# Patient Record
Sex: Male | Born: 1945 | Race: White | Hispanic: No | Marital: Single | Smoking: Former smoker
Health system: Southern US, Community
[De-identification: ages and names within clinical notes are randomized; demographics above are authoritative.]

## PROBLEM LIST (undated history)

## (undated) DIAGNOSIS — K429 Umbilical hernia without obstruction or gangrene: Secondary | ICD-10-CM

## (undated) DIAGNOSIS — J449 Chronic obstructive pulmonary disease, unspecified: Secondary | ICD-10-CM

## (undated) DIAGNOSIS — E785 Hyperlipidemia, unspecified: Secondary | ICD-10-CM

## (undated) DIAGNOSIS — T7840XA Allergy, unspecified, initial encounter: Secondary | ICD-10-CM

## (undated) DIAGNOSIS — M19019 Primary osteoarthritis, unspecified shoulder: Secondary | ICD-10-CM

## (undated) DIAGNOSIS — R7302 Impaired glucose tolerance (oral): Secondary | ICD-10-CM

## (undated) DIAGNOSIS — D649 Anemia, unspecified: Secondary | ICD-10-CM

## (undated) DIAGNOSIS — E119 Type 2 diabetes mellitus without complications: Secondary | ICD-10-CM

## (undated) DIAGNOSIS — Z8619 Personal history of other infectious and parasitic diseases: Secondary | ICD-10-CM

## (undated) DIAGNOSIS — G4730 Sleep apnea, unspecified: Secondary | ICD-10-CM

## (undated) DIAGNOSIS — M1990 Unspecified osteoarthritis, unspecified site: Secondary | ICD-10-CM

## (undated) DIAGNOSIS — K635 Polyp of colon: Secondary | ICD-10-CM

## (undated) DIAGNOSIS — Z8739 Personal history of other diseases of the musculoskeletal system and connective tissue: Secondary | ICD-10-CM

## (undated) HISTORY — PX: COLONOSCOPY: SHX174

## (undated) HISTORY — PX: TONSILLECTOMY AND ADENOIDECTOMY: SUR1326

## (undated) HISTORY — PX: EYE SURGERY: SHX253

---

## 2005-06-07 ENCOUNTER — Ambulatory Visit: Payer: Self-pay | Admitting: Gastroenterology

## 2007-09-15 ENCOUNTER — Ambulatory Visit: Payer: Self-pay

## 2010-06-08 ENCOUNTER — Ambulatory Visit: Payer: Self-pay | Admitting: Ophthalmology

## 2013-07-01 HISTORY — PX: WRIST MASS EXCISION: SHX2674

## 2013-09-09 ENCOUNTER — Ambulatory Visit: Payer: Self-pay | Admitting: Orthopedic Surgery

## 2013-09-10 LAB — PATHOLOGY REPORT

## 2014-09-08 ENCOUNTER — Ambulatory Visit
Admit: 2014-09-08 | Disposition: A | Discharge status: Home / Self Care | Payer: Self-pay | Attending: Infectious Diseases | Admitting: Infectious Diseases

## 2014-09-30 ENCOUNTER — Ambulatory Visit
Admit: 2014-09-30 | Disposition: A | Discharge status: Home / Self Care | Payer: Self-pay | Attending: Infectious Diseases | Admitting: Infectious Diseases

## 2014-10-22 NOTE — Op Note (Signed)
PATIENT NAME:  Connor Miranda, Connor Miranda MR#:  [id number] DATE OF BIRTH:  [birth year]  DATE OF PROCEDURE:  09/09/2013  PREOPERATIVE DIAGNOSIS:  Right volar mass of right wrist.  POSTOPERATIVE DIAGNOSIS:  Right volar mass of right wrist.   PROCEDURE:  Excision of mass.   ANESTHESIA:  General.   SURGEON:  Hessie Knows, M.D.   DESCRIPTION OF PROCEDURE:  The patient was brought to the Operating Room and after adequate anesthesia was obtained, the right arm was prepped and draped in the usual sterile fashion, appropriate patient identification and timeout procedures completed.  After this was completed, the tourniquet was raised and a curvilinear incision was made centered over the mass.  It appeared to be in line with the palmaris longus after opening the skin and subcutaneous tissue of approximately 1 to 1.5 cm diameter, mass was exposed.  It did appear to be within the body of the palmaris longus.  The palmaris longus was identified distally and proximally and the mass removed along with a portion of the palmaris longus and sent as a single specimen.  It appeared to be well-differentiated from the surrounding tissue, separated easily with any invasive characteristic.  After removal of this, the wound was closed with simple interrupted 4-0 nylon skin sutures, infiltrated with 0.5% Sensorcaine without epinephrine for postop analgesia.  The patient tolerated the procedure well and was sent to the recovery room in stable condition.   ESTIMATED BLOOD LOSS:  Minimal.   COMPLICATIONS:  None.   SPECIMEN:  Excised volar mass from the palmaris longus.   TOURNIQUET TIME:  13 minutes at 250 mmHg.    ____________________________ Laurene Footman, MD mjm:ea D: 09/09/2013 18:38:33 ET T: 09/10/2013 06:09:10 ET JOB#: [id number]  cc: Laurene Footman, MD, <Dictator> Laurene Footman MD ELECTRONICALLY SIGNED 09/10/2013 10:58

## 2014-11-07 ENCOUNTER — Encounter: Payer: Medicare Other | Attending: Infectious Diseases | Admitting: Dietician

## 2014-11-07 ENCOUNTER — Encounter: Payer: Self-pay | Admitting: Dietician

## 2014-11-07 VITALS — Ht 70.0 in | Wt 216.8 lb

## 2014-11-07 DIAGNOSIS — E119 Type 2 diabetes mellitus without complications: Secondary | ICD-10-CM | POA: Diagnosis present

## 2014-11-07 DIAGNOSIS — G4730 Sleep apnea, unspecified: Secondary | ICD-10-CM | POA: Insufficient documentation

## 2014-11-07 DIAGNOSIS — Z8739 Personal history of other diseases of the musculoskeletal system and connective tissue: Secondary | ICD-10-CM | POA: Insufficient documentation

## 2014-11-07 DIAGNOSIS — E785 Hyperlipidemia, unspecified: Secondary | ICD-10-CM | POA: Insufficient documentation

## 2014-11-07 NOTE — Progress Notes (Signed)

## 2014-12-12 ENCOUNTER — Encounter: Payer: Self-pay | Admitting: *Deleted

## 2014-12-12 ENCOUNTER — Encounter: Payer: Medicare Other | Attending: Infectious Diseases | Admitting: *Deleted

## 2014-12-12 VITALS — Ht 70.0 in | Wt 206.5 lb

## 2014-12-12 DIAGNOSIS — E119 Type 2 diabetes mellitus without complications: Secondary | ICD-10-CM | POA: Diagnosis not present

## 2014-12-12 NOTE — Progress Notes (Signed)

## 2014-12-19 ENCOUNTER — Encounter: Payer: Self-pay | Admitting: Dietician

## 2014-12-19 ENCOUNTER — Encounter: Payer: Medicare Other | Admitting: Dietician

## 2014-12-19 VITALS — BP 120/90 | Ht 70.0 in | Wt 203.8 lb

## 2014-12-19 DIAGNOSIS — E119 Type 2 diabetes mellitus without complications: Secondary | ICD-10-CM

## 2014-12-19 NOTE — Progress Notes (Signed)

## 2015-01-05 ENCOUNTER — Encounter: Payer: Self-pay | Admitting: Dietician

## 2015-08-09 ENCOUNTER — Encounter: Payer: Self-pay | Admitting: *Deleted

## 2015-08-10 ENCOUNTER — Ambulatory Visit: Payer: Medicare Other | Admitting: Anesthesiology

## 2015-08-10 ENCOUNTER — Encounter: Payer: Self-pay | Admitting: *Deleted

## 2015-08-10 ENCOUNTER — Ambulatory Visit
Admission: RE | Admit: 2015-08-10 | Discharge: 2015-08-10 | Disposition: A | Discharge status: Home / Self Care | Payer: Medicare Other | Source: Ambulatory Visit | Attending: Gastroenterology | Admitting: Gastroenterology

## 2015-08-10 ENCOUNTER — Encounter
Admission: RE | Disposition: A | Discharge status: Home / Self Care | Payer: Self-pay | Source: Ambulatory Visit | Attending: Gastroenterology

## 2015-08-10 DIAGNOSIS — Z79899 Other long term (current) drug therapy: Secondary | ICD-10-CM | POA: Diagnosis not present

## 2015-08-10 DIAGNOSIS — Z87891 Personal history of nicotine dependence: Secondary | ICD-10-CM | POA: Insufficient documentation

## 2015-08-10 DIAGNOSIS — Z1211 Encounter for screening for malignant neoplasm of colon: Secondary | ICD-10-CM | POA: Diagnosis not present

## 2015-08-10 DIAGNOSIS — J449 Chronic obstructive pulmonary disease, unspecified: Secondary | ICD-10-CM | POA: Insufficient documentation

## 2015-08-10 DIAGNOSIS — M109 Gout, unspecified: Secondary | ICD-10-CM | POA: Insufficient documentation

## 2015-08-10 DIAGNOSIS — E7439 Other disorders of intestinal carbohydrate absorption: Secondary | ICD-10-CM | POA: Insufficient documentation

## 2015-08-10 DIAGNOSIS — M19019 Primary osteoarthritis, unspecified shoulder: Secondary | ICD-10-CM | POA: Insufficient documentation

## 2015-08-10 DIAGNOSIS — D124 Benign neoplasm of descending colon: Secondary | ICD-10-CM | POA: Diagnosis not present

## 2015-08-10 DIAGNOSIS — Z86010 Personal history of colonic polyps: Secondary | ICD-10-CM | POA: Diagnosis not present

## 2015-08-10 DIAGNOSIS — E119 Type 2 diabetes mellitus without complications: Secondary | ICD-10-CM | POA: Insufficient documentation

## 2015-08-10 DIAGNOSIS — G4730 Sleep apnea, unspecified: Secondary | ICD-10-CM | POA: Diagnosis not present

## 2015-08-10 DIAGNOSIS — E785 Hyperlipidemia, unspecified: Secondary | ICD-10-CM | POA: Insufficient documentation

## 2015-08-10 HISTORY — DX: Chronic obstructive pulmonary disease, unspecified: J44.9

## 2015-08-10 HISTORY — DX: Hyperlipidemia, unspecified: E78.5

## 2015-08-10 HISTORY — DX: Allergy, unspecified, initial encounter: T78.40XA

## 2015-08-10 HISTORY — PX: COLONOSCOPY WITH PROPOFOL: SHX5780

## 2015-08-10 HISTORY — DX: Personal history of other diseases of the musculoskeletal system and connective tissue: Z87.39

## 2015-08-10 HISTORY — DX: Sleep apnea, unspecified: G47.30

## 2015-08-10 HISTORY — DX: Impaired glucose tolerance (oral): R73.02

## 2015-08-10 HISTORY — DX: Personal history of other infectious and parasitic diseases: Z86.19

## 2015-08-10 HISTORY — DX: Primary osteoarthritis, unspecified shoulder: M19.019

## 2015-08-10 HISTORY — DX: Type 2 diabetes mellitus without complications: E11.9

## 2015-08-10 HISTORY — DX: Unspecified osteoarthritis, unspecified site: M19.90

## 2015-08-10 HISTORY — DX: Polyp of colon: K63.5

## 2015-08-10 LAB — GLUCOSE, CAPILLARY: Glucose-Capillary: 134 mg/dL — ABNORMAL HIGH (ref 65–99)

## 2015-08-10 SURGERY — COLONOSCOPY WITH PROPOFOL
Anesthesia: General

## 2015-08-10 MED ORDER — SODIUM CHLORIDE 0.9 % IV SOLN
INTRAVENOUS | Status: DC
  Administered 2015-08-10: 08:00:00 via INTRAVENOUS

## 2015-08-10 MED ORDER — SODIUM CHLORIDE 0.9 % IV SOLN: INTRAVENOUS | Status: DC

## 2015-08-10 MED ORDER — PROPOFOL 500 MG/50ML IV EMUL
INTRAVENOUS | Status: DC | PRN
  Administered 2015-08-10: 75 ug/kg/min via INTRAVENOUS

## 2015-08-10 MED ORDER — PROPOFOL 10 MG/ML IV BOLUS
INTRAVENOUS | Status: DC | PRN
  Administered 2015-08-10: 80 mg via INTRAVENOUS

## 2015-08-10 MED ORDER — LACTATED RINGERS IV SOLN
INTRAVENOUS | Status: DC | PRN
  Administered 2015-08-10: 08:00:00 via INTRAVENOUS

## 2015-08-10 NOTE — Transfer of Care (Signed)
Immediate Anesthesia Transfer of Care Note  Patient: Connor Miranda  Procedure(s) Performed: Procedure(s): COLONOSCOPY WITH PROPOFOL (N/A)  Patient Location: PACU and Endoscopy Unit  Anesthesia Type:General  Level of Consciousness: awake, alert  and oriented  Airway & Oxygen Therapy: Patient Spontanous Breathing  Post-op Assessment: Report given to RN and Post -op Vital signs reviewed and stable  Post vital signs: Reviewed and stable  Last Vitals:  Filed Vitals:   08/10/15 0736  BP: 134/86  Pulse: 81  Temp: [id number]    Complications: No apparent anesthesia complications

## 2015-08-10 NOTE — Anesthesia Postprocedure Evaluation (Signed)
Anesthesia Post Note  Patient: Connor Miranda  Procedure(s) Performed: Procedure(s) (LRB): COLONOSCOPY WITH PROPOFOL (N/A)  Patient location during evaluation: Endoscopy Anesthesia Type: General Level of consciousness: awake and alert Pain management: pain level controlled Vital Signs Assessment: post-procedure vital signs reviewed and stable Respiratory status: spontaneous breathing, nonlabored ventilation, respiratory function stable and patient connected to nasal cannula oxygen Cardiovascular status: blood pressure returned to baseline and stable Postop Assessment: no signs of nausea or vomiting Anesthetic complications: no    Last Vitals:  Filed Vitals:   08/10/15 0736 08/10/15 0841  BP: 134/86   Pulse: 81   Temp: 35.9 C 35.8 C    Last Pain:  Filed Vitals:   08/10/15 1007  PainSc: 0-No pain                 Precious Haws Piscitello

## 2015-08-10 NOTE — Op Note (Signed)
[facility] Gastroenterology Patient Name: Connor Miranda Procedure Date: 08/10/2015 8:03 AM MRN: [id number] Account #: [id number] Date of Birth: 1945/09/29 Admit Type: Outpatient Age: 70 Room: [facility] ENDO ROOM 4 Gender: Male Note Status: Finalized Procedure:         Colonoscopy Indications:       Screening for colorectal malignant neoplasm Providers:         Lupita Dawn. Candace Cruise, MD Referring MD:      Adrian Prows (Referring MD) Medicines:         Monitored Anesthesia Care Complications:     No immediate complications. Procedure:         Pre-Anesthesia Assessment:                    - Prior to the procedure, a History and Physical was                     performed, and patient medications, allergies and                     sensitivities were reviewed. The patient's tolerance of                     previous anesthesia was reviewed.                    - The risks and benefits of the procedure and the sedation                     options and risks were discussed with the patient. All                     questions were answered and informed consent was obtained.                    - After reviewing the risks and benefits, the patient was                     deemed in satisfactory condition to undergo the procedure.                    After obtaining informed consent, the colonoscope was                     passed under direct vision. Throughout the procedure, the                     patient's blood pressure, pulse, and oxygen saturations                     were monitored continuously. The Colonoscope was                     introduced through the anus and advanced to the the cecum,                     identified by appendiceal orifice and ileocecal valve. The                     colonoscopy was performed without difficulty. The patient                     tolerated the procedure well. The quality of the bowel  preparation was good. Findings:      A  medium polyp was found in the descending colon. The polyp was sessile.       The polyp was removed with a saline injection-lift technique using a hot       snare. Resection and retrieval were complete.      The exam was otherwise without abnormality. Impression:        - One medium polyp in the descending colon. Resected and                     retrieved.                    - The examination was otherwise normal. Recommendation:    - Discharge patient to home.                    - Await pathology results.                    - Repeat colonoscopy in 3 - 5 years for surveillance based                     on pathology results.                    - The findings and recommendations were discussed with the                     patient. Procedure Code(s): --- Professional ---                    [phone], Colonoscopy, flexible; with endoscopic mucosal                     resection Diagnosis Code(s): --- Professional ---                    Z12.11, Encounter for screening for malignant neoplasm of                     colon                    D12.4, Benign neoplasm of descending colon CPT copyright 2014 American Medical Association. All rights reserved. The codes documented in this report are preliminary and upon coder review may  be revised to meet current compliance requirements. [name], MD 08/10/2015 8:38:26 AM This report has been signed electronically. Number of Addenda: 0 Note Initiated On: 08/10/2015 8:03 AM Scope Withdrawal Time: 0 hours 25 minutes 2 seconds  Total Procedure Duration: 0 hours 27 minutes 50 seconds       [facility]

## 2015-08-10 NOTE — H&P (Signed)
Primary Care Physician:  Adrian Prows, MD Primary Gastroenterologist:  Dr. Candace Cruise  Pre-Procedure History & Physical: HPI:  Connor Miranda is a 70 y.o. male is here for an colonoscopy.   Past Medical History  Diagnosis Date  . Allergic state   . Colon polyps   . Degenerative arthritis of shoulder region   . Type 2 diabetes mellitus ([place])   . Sleep apnea   . Glucose intolerance (impaired glucose tolerance)   . History of chicken pox   . History of gout   . Hyperlipidemia   . Osteoarthritis   . COPD (chronic obstructive pulmonary disease) [facility])     Past Surgical History  Procedure Laterality Date  . Eye surgery Bilateral     Eyelid surgery  . Colonoscopy    . Tonsillectomy and adenoidectomy    . Wrist mass excision Right     Prior to Admission medications   Medication Sig Start Date End Date Taking? Authorizing Provider  acetaZOLAMIDE (DIAMOX) 125 MG tablet Take 125 mg by mouth 2 (two) times daily.   Yes Historical Provider, MD  atorvastatin (LIPITOR) 40 MG tablet  12/19/14  Yes Historical Provider, MD  ibuprofen (ADVIL,MOTRIN) 200 MG tablet Take by mouth.   Yes Historical Provider, MD  indomethacin (INDOCIN) 50 MG capsule Take 50 mg by mouth 2 (two) times daily as needed.    Yes Historical Provider, MD  ONE TOUCH ULTRA TEST test strip  12/19/14  Yes Historical Provider, MD  tadalafil (CIALIS) 20 MG tablet Take by mouth.   Yes Historical Provider, MD  Guaifenesin-Codeine (CODEINE-GUAIFENESIN PO) Take 5 mLs by mouth at bedtime as needed (Take 40mL nightly as needed for cough; codeine-guaifenesin 10-100mg /56mL oral liquid). Reported on 08/10/2015    Historical Provider, MD  Jonetta Speak LANCETS [id number] [place]  12/19/14   Historical Provider, MD    Allergies as of 07/05/2015  . (Not on File)    History reviewed. No pertinent family history.  Social History   Social History  . Marital Status: Single    Spouse Name: N/A  . Number of Children: N/A  . Years of Education:  N/A   Occupational History  . Not on file.   Social History Main Topics  . Smoking status: Former Smoker    Quit date: 08/09/1994  . Smokeless tobacco: Not on file  . Alcohol Use: 1.8 oz/week    3 Cans of beer per week     Comment: 2 lite beers, 1 glass wine in past week  . Drug Use: No  . Sexual Activity: Not on file   Other Topics Concern  . Not on file   Social History Narrative    Review of Systems: See HPI, otherwise negative ROS  Physical Exam: BP 134/86 mmHg  Pulse 81  Temp(Src) 96.7 F (35.9 C) (Tympanic)  Ht 5\' 10"  (1.778 m)  Wt 88.451 kg (195 lb)  BMI 27.98 kg/m2  SpO2 97% General:   Alert,  pleasant and cooperative in NAD Head:  Normocephalic and atraumatic. Neck:  Supple; no masses or thyromegaly. Lungs:  Clear throughout to auscultation.    Heart:  Regular rate and rhythm. Abdomen:  Soft, nontender and nondistended. Normal bowel sounds, without guarding, and without rebound.   Neurologic:  Alert and  oriented x4;  grossly normal neurologically.  Impression/Plan: Connor Miranda is here for an colonoscopy to be performed for screening. Risks, benefits, limitations, and alternatives regarding colonoscopy have been reviewed with the patient.  Questions have  been answered.  All parties agreeable.   Jelena Malicoat, Lupita Dawn, MD  08/10/2015, 7:55 AM

## 2015-08-10 NOTE — Anesthesia Preprocedure Evaluation (Signed)
Anesthesia Evaluation  Patient identified by MRN, date of birth, ID band Patient awake    Reviewed: Allergy & Precautions, H&P , NPO status , Patient's Chart, lab work & pertinent test results  History of Anesthesia Complications Negative for: history of anesthetic complications  Airway Mallampati: III  TM Distance: >3 FB Neck ROM: limited    Dental  (+) Poor Dentition, Chipped   Pulmonary neg pulmonary ROS, neg shortness of breath, sleep apnea , COPD, former smoker,    Pulmonary exam normal breath sounds clear to auscultation       Cardiovascular (-) angina(-) Past MI and (-) DOE negative cardio ROS Normal cardiovascular exam Rhythm:regular Rate:Normal     Neuro/Psych negative neurological ROS  negative psych ROS   GI/Hepatic negative GI ROS, Neg liver ROS,   Endo/Other  diabetes, Type 2  Renal/GU negative Renal ROS  negative genitourinary   Musculoskeletal  (+) Arthritis ,   Abdominal   Peds  Hematology negative hematology ROS (+)   Anesthesia Other Findings Past Medical History:   Allergic state                                               Colon polyps                                                 Degenerative arthritis of shoulder region                    Type 2 diabetes mellitus (HCC)                               Sleep apnea                                                  Glucose intolerance (impaired glucose toleranc*              History of chicken pox                                       History of gout                                              Hyperlipidemia                                               Osteoarthritis                                              Past Surgical History:   EYE SURGERY  Bilateral                Comment:Eyelid surgery   COLONOSCOPY                                                   TONSILLECTOMY AND ADENOIDECTOMY                                WRIST MASS EXCISION                             Right                Reproductive/Obstetrics negative OB ROS                             Anesthesia Physical Anesthesia Plan  ASA: III  Anesthesia Plan: General   Post-op Pain Management:    Induction:   Airway Management Planned:   Additional Equipment:   Intra-op Plan:   Post-operative Plan:   Informed Consent: I have reviewed the patients History and Physical, chart, labs and discussed the procedure including the risks, benefits and alternatives for the proposed anesthesia with the patient or authorized representative who has indicated his/her understanding and acceptance.   Dental Advisory Given  Plan Discussed with: Anesthesiologist, CRNA and Surgeon  Anesthesia Plan Comments:         Anesthesia Quick Evaluation

## 2015-08-14 LAB — SURGICAL PATHOLOGY

## 2016-07-15 ENCOUNTER — Other Ambulatory Visit: Payer: Self-pay | Admitting: Physical Medicine and Rehabilitation

## 2016-07-15 DIAGNOSIS — M5416 Radiculopathy, lumbar region: Secondary | ICD-10-CM

## 2016-07-25 ENCOUNTER — Ambulatory Visit
Admission: RE | Admit: 2016-07-25 | Discharge: 2016-07-25 | Disposition: A | Discharge status: Home / Self Care | Payer: Medicare Other | Source: Ambulatory Visit | Attending: Physical Medicine and Rehabilitation | Admitting: Physical Medicine and Rehabilitation

## 2016-07-25 DIAGNOSIS — M5416 Radiculopathy, lumbar region: Secondary | ICD-10-CM

## 2016-07-25 DIAGNOSIS — M5116 Intervertebral disc disorders with radiculopathy, lumbar region: Secondary | ICD-10-CM | POA: Diagnosis not present

## 2016-07-25 DIAGNOSIS — M5127 Other intervertebral disc displacement, lumbosacral region: Secondary | ICD-10-CM | POA: Insufficient documentation

## 2016-07-25 DIAGNOSIS — M4807 Spinal stenosis, lumbosacral region: Secondary | ICD-10-CM | POA: Diagnosis not present

## 2018-05-06 ENCOUNTER — Other Ambulatory Visit: Payer: Self-pay

## 2018-05-06 ENCOUNTER — Other Ambulatory Visit: Payer: Self-pay | Admitting: Physical Medicine and Rehabilitation

## 2018-05-06 DIAGNOSIS — M25512 Pain in left shoulder: Secondary | ICD-10-CM

## 2018-05-25 ENCOUNTER — Ambulatory Visit: Payer: Medicare Other

## 2018-05-31 DIAGNOSIS — D649 Anemia, unspecified: Secondary | ICD-10-CM

## 2018-05-31 DIAGNOSIS — G4730 Sleep apnea, unspecified: Secondary | ICD-10-CM

## 2018-05-31 HISTORY — DX: Anemia, unspecified: D64.9

## 2018-05-31 HISTORY — DX: Sleep apnea, unspecified: G47.30

## 2018-06-04 ENCOUNTER — Ambulatory Visit
Admission: RE | Admit: 2018-06-04 | Discharge: 2018-06-04 | Disposition: A | Discharge status: Home / Self Care | Payer: Medicare Other | Source: Ambulatory Visit | Attending: Physical Medicine and Rehabilitation | Admitting: Physical Medicine and Rehabilitation

## 2018-06-04 DIAGNOSIS — M19012 Primary osteoarthritis, left shoulder: Secondary | ICD-10-CM | POA: Diagnosis not present

## 2018-06-04 DIAGNOSIS — M7582 Other shoulder lesions, left shoulder: Secondary | ICD-10-CM | POA: Insufficient documentation

## 2018-06-04 DIAGNOSIS — M25512 Pain in left shoulder: Secondary | ICD-10-CM | POA: Diagnosis not present

## 2018-06-05 NOTE — Discharge Instructions (Signed)

## 2018-06-08 NOTE — Anesthesia Preprocedure Evaluation (Addendum)
Anesthesia Evaluation  Patient identified by MRN, date of birth, ID band Patient awake    Reviewed: Allergy & Precautions, NPO status , Patient's Chart, lab work & pertinent test results  History of Anesthesia Complications Negative for: history of anesthetic complications  Airway Mallampati: II   Neck ROM: Full    Dental  (+) Partial Upper Lower implants:   Pulmonary sleep apnea , former smoker (quit 1996),    Pulmonary exam normal breath sounds clear to auscultation       Cardiovascular Exercise Tolerance: Good negative cardio ROS   Rhythm:Irregular Rate:Normal  ECG 06/10/18: NSR with sinus arrhythmia   Neuro/Psych negative neurological ROS     GI/Hepatic negative GI ROS,   Endo/Other  diabetes, Type 2  Renal/GU negative Renal ROS     Musculoskeletal  (+) Arthritis , Gout    Abdominal   Peds  Hematology B12 deficiency   Anesthesia Other Findings   Reproductive/Obstetrics                            Anesthesia Physical Anesthesia Plan  ASA: II  Anesthesia Plan: MAC   Post-op Pain Management:    Induction: Intravenous  PONV Risk Score and Plan: 2 and TIVA and Midazolam  Airway Management Planned: Natural Airway  Additional Equipment:   Intra-op Plan:   Post-operative Plan:   Informed Consent: I have reviewed the patients History and Physical, chart, labs and discussed the procedure including the risks, benefits and alternatives for the proposed anesthesia with the patient or authorized representative who has indicated his/her understanding and acceptance.     Plan Discussed with: CRNA  Anesthesia Plan Comments:       Anesthesia Quick Evaluation

## 2018-06-09 HISTORY — PX: EYE SURGERY: SHX253

## 2018-06-10 ENCOUNTER — Encounter
Admission: RE | Disposition: A | Discharge status: Home / Self Care | Payer: Self-pay | Source: Ambulatory Visit | Attending: Ophthalmology

## 2018-06-10 ENCOUNTER — Ambulatory Visit: Payer: Medicare Other | Admitting: Anesthesiology

## 2018-06-10 ENCOUNTER — Ambulatory Visit
Admission: RE | Admit: 2018-06-10 | Discharge: 2018-06-10 | Disposition: A | Discharge status: Home / Self Care | Payer: Medicare Other | Source: Ambulatory Visit | Attending: Ophthalmology | Admitting: Ophthalmology

## 2018-06-10 DIAGNOSIS — Z87891 Personal history of nicotine dependence: Secondary | ICD-10-CM | POA: Insufficient documentation

## 2018-06-10 DIAGNOSIS — J449 Chronic obstructive pulmonary disease, unspecified: Secondary | ICD-10-CM | POA: Diagnosis not present

## 2018-06-10 DIAGNOSIS — H5703 Miosis: Secondary | ICD-10-CM | POA: Insufficient documentation

## 2018-06-10 DIAGNOSIS — H2512 Age-related nuclear cataract, left eye: Secondary | ICD-10-CM | POA: Insufficient documentation

## 2018-06-10 DIAGNOSIS — E538 Deficiency of other specified B group vitamins: Secondary | ICD-10-CM | POA: Diagnosis not present

## 2018-06-10 DIAGNOSIS — Z79899 Other long term (current) drug therapy: Secondary | ICD-10-CM | POA: Diagnosis not present

## 2018-06-10 DIAGNOSIS — E1136 Type 2 diabetes mellitus with diabetic cataract: Secondary | ICD-10-CM | POA: Insufficient documentation

## 2018-06-10 DIAGNOSIS — E7800 Pure hypercholesterolemia, unspecified: Secondary | ICD-10-CM | POA: Insufficient documentation

## 2018-06-10 DIAGNOSIS — Z7984 Long term (current) use of oral hypoglycemic drugs: Secondary | ICD-10-CM | POA: Diagnosis not present

## 2018-06-10 DIAGNOSIS — G4730 Sleep apnea, unspecified: Secondary | ICD-10-CM | POA: Insufficient documentation

## 2018-06-10 DIAGNOSIS — Z7982 Long term (current) use of aspirin: Secondary | ICD-10-CM | POA: Insufficient documentation

## 2018-06-10 HISTORY — PX: CATARACT EXTRACTION W/PHACO: SHX586

## 2018-06-10 LAB — GLUCOSE, CAPILLARY
GLUCOSE-CAPILLARY: 137 mg/dL — AB (ref 70–99)
Glucose-Capillary: 142 mg/dL — ABNORMAL HIGH (ref 70–99)

## 2018-06-10 SURGERY — PHACOEMULSIFICATION, CATARACT, WITH IOL INSERTION
Anesthesia: Monitor Anesthesia Care | Site: Eye | Laterality: Left

## 2018-06-10 MED ORDER — ARMC OPHTHALMIC DILATING DROPS
1.0000 "application " | OPHTHALMIC | Status: DC | PRN
  Administered 2018-06-10 (×3): 1 via OPHTHALMIC

## 2018-06-10 MED ORDER — ONDANSETRON HCL 4 MG/2ML IJ SOLN: 4.0000 mg | Freq: Once | INTRAMUSCULAR | Status: DC | PRN

## 2018-06-10 MED ORDER — EPINEPHRINE PF 1 MG/ML IJ SOLN
INTRAOCULAR | Status: DC | PRN
  Administered 2018-06-10: 66 mL via OPHTHALMIC

## 2018-06-10 MED ORDER — MIDAZOLAM HCL 2 MG/2ML IJ SOLN: INTRAMUSCULAR | Status: DC | PRN

## 2018-06-10 MED ORDER — LACTATED RINGERS IV SOLN: INTRAVENOUS | Status: DC

## 2018-06-10 MED ORDER — TETRACAINE HCL 0.5 % OP SOLN
1.0000 [drp] | OPHTHALMIC | Status: DC | PRN
  Administered 2018-06-10 (×2): 1 [drp] via OPHTHALMIC

## 2018-06-10 MED ORDER — NA HYALUR & NA CHOND-NA HYALUR 0.4-0.35 ML IO KIT
PACK | INTRAOCULAR | Status: DC | PRN
  Administered 2018-06-10: 1 mL via INTRAOCULAR

## 2018-06-10 MED ORDER — ACETAMINOPHEN 160 MG/5ML PO SOLN: 325.0000 mg | ORAL | Status: DC | PRN

## 2018-06-10 MED ORDER — BRIMONIDINE TARTRATE-TIMOLOL 0.2-0.5 % OP SOLN
OPHTHALMIC | Status: DC | PRN
  Administered 2018-06-10: 1 [drp] via OPHTHALMIC

## 2018-06-10 MED ORDER — LIDOCAINE HCL (PF) 2 % IJ SOLN
INTRAOCULAR | Status: DC | PRN
  Administered 2018-06-10: 1 mL

## 2018-06-10 MED ORDER — MOXIFLOXACIN HCL 0.5 % OP SOLN
1.0000 [drp] | OPHTHALMIC | Status: DC | PRN
  Administered 2018-06-10 (×3): 1 [drp] via OPHTHALMIC

## 2018-06-10 MED ORDER — MIDAZOLAM HCL 2 MG/2ML IJ SOLN
INTRAMUSCULAR | Status: DC | PRN
  Administered 2018-06-10: 2 mg via INTRAVENOUS

## 2018-06-10 MED ORDER — ACETAMINOPHEN 325 MG PO TABS: 650.0000 mg | ORAL_TABLET | Freq: Once | ORAL | Status: DC | PRN

## 2018-06-10 MED ORDER — CEFUROXIME OPHTHALMIC INJECTION 1 MG/0.1 ML
INJECTION | OPHTHALMIC | Status: DC | PRN
  Administered 2018-06-10: 0.1 mL via INTRACAMERAL

## 2018-06-10 MED ORDER — FENTANYL CITRATE (PF) 100 MCG/2ML IJ SOLN
INTRAMUSCULAR | Status: DC | PRN
  Administered 2018-06-10 (×2): 50 ug via INTRAVENOUS

## 2018-06-10 SURGICAL SUPPLY — 27 items
CANNULA ANT/CHMB 27G (MISCELLANEOUS) ×1 IMPLANT
CANNULA ANT/CHMB 27GA (MISCELLANEOUS) ×3 IMPLANT
GLOVE SURG LX 7.5 STRW (GLOVE) ×2
GLOVE SURG LX STRL 7.5 STRW (GLOVE) ×1 IMPLANT
GLOVE SURG TRIUMPH 8.0 PF LTX (GLOVE) ×3 IMPLANT
GOWN STRL REUS W/ TWL LRG LVL3 (GOWN DISPOSABLE) ×2 IMPLANT
GOWN STRL REUS W/TWL LRG LVL3 (GOWN DISPOSABLE) ×4
MARKER SKIN DUAL TIP RULER LAB (MISCELLANEOUS) ×3 IMPLANT
NDL FILTER BLUNT 18X1 1/2 (NEEDLE) ×1 IMPLANT
NDL RETROBULBAR .5 NSTRL (NEEDLE) IMPLANT
NEEDLE FILTER BLUNT 18X 1/2SAF (NEEDLE) ×2
NEEDLE FILTER BLUNT 18X1 1/2 (NEEDLE) ×1 IMPLANT
PACK CATARACT BRASINGTON (MISCELLANEOUS) ×3 IMPLANT
PACK EYE AFTER SURG (MISCELLANEOUS) ×3 IMPLANT
PACK OPTHALMIC (MISCELLANEOUS) ×3 IMPLANT
RING MALYGIN (MISCELLANEOUS) ×2 IMPLANT
RING MALYGIN 7.0 (MISCELLANEOUS) IMPLANT
SUT ETHILON 10-0 CS-B-6CS-B-6 (SUTURE)
SUT VICRYL  9 0 (SUTURE)
SUT VICRYL 9 0 (SUTURE) IMPLANT
SUTURE EHLN 10-0 CS-B-6CS-B-6 (SUTURE) IMPLANT
SYR 3ML LL SCALE MARK (SYRINGE) ×3 IMPLANT
SYR 5ML LL (SYRINGE) ×3 IMPLANT
SYR TB 1ML LUER SLIP (SYRINGE) ×3 IMPLANT
TECHNIS MULTIFOCAL IOL 23.0 D (Intraocular Lens) ×2 IMPLANT
WATER STERILE IRR 500ML POUR (IV SOLUTION) ×3 IMPLANT
WIPE NON LINTING 3.25X3.25 (MISCELLANEOUS) ×3 IMPLANT

## 2018-06-10 NOTE — Anesthesia Procedure Notes (Signed)
Procedure Name: MAC Date/Time: 06/10/2018 8:18 AM Performed by: Cameron Ali, CRNA Pre-anesthesia Checklist: Patient identified, Emergency Drugs available, Suction available, Timeout performed and Patient being monitored Patient Re-evaluated:Patient Re-evaluated prior to induction Oxygen Delivery Method: Nasal cannula Placement Confirmation: positive ETCO2

## 2018-06-10 NOTE — Op Note (Signed)
OPERATIVE NOTE  Connor Miranda [id number] 06/10/2018  PREOPERATIVE DIAGNOSIS:   Nuclear sclerotic cataract left eye with miotic pupil      H25.12   POSTOPERATIVE DIAGNOSIS:   Nuclear sclerotic cataract left eye with miotic pupil.     PROCEDURE:  Phacoemulsification with posterior chamber intraocular lens implantation of the left eye which required pupil stretching with the Malyugin pupil expansion device   LENS:   Implant Name Type Inv. Item Serial No. Manufacturer Lot No. LRB No. Used  LENS IOL RESTORE 23.0 - [id number]  LENS IOL RESTORE 23.0 [id number] AMO  Left 1     Tecnis [id number] (NOT ReSTOR) 23.0 with 2.75 D Add power   ULTRASOUND TIME: 16 % of 1 minutes, 3 seconds.  CDE 10.2   SURGEON:  Wyonia Hough, MD   ANESTHESIA: Topical with tetracaine drops and 2% Xylocaine jelly, augmented with 1% preservative-free intracameral lidocaine.   COMPLICATIONS:  None.   DESCRIPTION OF PROCEDURE:  The patient was identified in the holding room and transported to the operating room and placed in the supine position under the operating microscope.  The left eye was identified as the operative eye and it was prepped and draped in the usual sterile ophthalmic fashion.   A 1 millimeter clear-corneal paracentesis was made at the 1:30 position.  The anterior chamber was filled with Viscoat viscoelastic.  0.5 ml of preservative-free 1% lidocaine was injected into the anterior chamber.  A 2.4 millimeter keratome was used to make a near-clear corneal incision at the 10:30 position.  A Malyugin pupil expander was then placed through the main incision and into the anterior chamber of the eye.  The edge of the iris was secured on the lip of the pupil expander and it was released, thereby expanding the pupil to approximately 6 millimeters for completion of the cataract surgery.  Additional Viscoat was placed in the anterior chamber.  A cystotome and capsulorrhexis forceps were used to make a curvilinear  capsulorrhexis.   Balanced salt solution was used to hydrodissect and hydrodelineate the lens nucleus.   Phacoemulsification was used in stop and chop fashion to remove the lens, nucleus and epinucleus.  The remaining cortex was aspirated using the irrigation aspiration handpiece.  Additional Provisc was placed into the eye to distend the capsular bag for lens placement.  A lens was then injected into the capsular bag.  The pupil expanding ring was removed using a Kuglen hook and insertion device. The remaining viscoelastic was aspirated from the capsular bag and the anterior chamber.  The anterior chamber was filled with balanced salt solution to inflate to a physiologic pressure.   Wounds were hydrated with balanced salt solution.  The anterior chamber was inflated to a physiologic pressure with balanced salt solution.  No wound leaks were noted. Cefuroxime 0.1 ml of a 10mg /ml solution was injected into the anterior chamber for a dose of 1 mg of intracameral antibiotic at the completion of the case.   Timolol and Brimonidine drops were applied to the eye.  The patient was taken to the recovery room in stable condition without complications of anesthesia or surgery.  [name] 06/10/2018, 8:43 AM

## 2018-06-10 NOTE — Transfer of Care (Signed)
Immediate Anesthesia Transfer of Care Note  Patient: Connor Miranda  Procedure(s) Performed: CATARACT EXTRACTION PHACO AND INTRAOCULAR LENS PLACEMENT (IOC)V  LEFT DIABETIC MULTIFOCAL LENS (Left Eye)  Patient Location: PACU  Anesthesia Type: MAC  Level of Consciousness: awake, alert  and patient cooperative  Airway and Oxygen Therapy: Patient Spontanous Breathing and Patient connected to supplemental oxygen  Post-op Assessment: Post-op Vital signs reviewed, Patient's Cardiovascular Status Stable, Respiratory Function Stable, Patent Airway and No signs of Nausea or vomiting  Post-op Vital Signs: Reviewed and stable  Complications: No apparent anesthesia complications

## 2018-06-10 NOTE — H&P (Signed)

## 2018-06-10 NOTE — Anesthesia Postprocedure Evaluation (Signed)
Anesthesia Post Note  Patient: Connor Miranda  Procedure(s) Performed: CATARACT EXTRACTION PHACO AND INTRAOCULAR LENS PLACEMENT (IOC)V  LEFT DIABETIC MULTIFOCAL LENS (Left Eye)  Patient location during evaluation: PACU Anesthesia Type: MAC Level of consciousness: awake and alert, oriented and patient cooperative Pain management: pain level controlled Vital Signs Assessment: post-procedure vital signs reviewed and stable Respiratory status: spontaneous breathing, nonlabored ventilation and respiratory function stable Cardiovascular status: blood pressure returned to baseline and stable Postop Assessment: adequate PO intake Anesthetic complications: no    Darrin Nipper

## 2018-06-11 ENCOUNTER — Encounter: Payer: Self-pay | Admitting: Ophthalmology

## 2018-06-26 ENCOUNTER — Ambulatory Visit: Payer: Medicare Other | Attending: Internal Medicine

## 2018-06-26 DIAGNOSIS — G4733 Obstructive sleep apnea (adult) (pediatric): Secondary | ICD-10-CM | POA: Insufficient documentation

## 2018-06-29 ENCOUNTER — Other Ambulatory Visit: Payer: Self-pay

## 2018-06-29 ENCOUNTER — Encounter
Admission: RE | Admit: 2018-06-29 | Discharge: 2018-06-29 | Disposition: A | Discharge status: Home / Self Care | Payer: Medicare Other | Source: Ambulatory Visit | Attending: Surgery | Admitting: Surgery

## 2018-06-29 DIAGNOSIS — Z01812 Encounter for preprocedural laboratory examination: Secondary | ICD-10-CM | POA: Insufficient documentation

## 2018-06-29 HISTORY — DX: Umbilical hernia without obstruction or gangrene: K42.9

## 2018-06-29 HISTORY — DX: Anemia, unspecified: D64.9

## 2018-06-29 LAB — PROTIME-INR
INR: 0.89
Prothrombin Time: 12 s (ref 11.4–15.2)

## 2018-06-29 LAB — APTT: aPTT: 37 s — ABNORMAL HIGH (ref 24–36)

## 2018-06-29 NOTE — Patient Instructions (Signed)
Your procedure is scheduled on: Thursday, July 09, 2018  Report to [facility]     DO NOT STOP ON THE FIRST FLOOR TO REGISTER  To find out your arrival time please call [phone] between 1PM - 3PM on Wednesday July 08, 2018  Remember: Instructions that are not followed completely may result in serious medical risk,  up to and including death, or upon the discretion of your surgeon and anesthesiologist your  surgery may need to be rescheduled.     _X__ 1. Do not eat food after midnight the night before your procedure.                 No gum chewing or hard candies.                      ABSOLUTELY NOTHING SOLID IN YOUR MOUTH AFTER MIDNIGHT                   You may drink clear liquids up to 2 hours before you are scheduled to arrive for your surgery-                   DO not drink clear liquids within 2 hours of the start of your surgery.                  Clear Liquids include:  water, apple juice without pulp, clear carbohydrate                 drink such as Clearfast of Gatorade, Black Coffee or Tea (Do not add                 anything to coffee or tea).  __X__2.  On the morning of surgery brush your teeth with toothpaste and water,                    You may rinse your mouth with mouthwash if you wish.                       Do not swallow any toothpaste of mouthwash.     _X__ 3.  No Alcohol for 24 hours before or after surgery.   _X__ 4.  Do Not Smoke or use e-cigarettes For 24 Hours Prior to Your Surgery.                 Do not use any chewable tobacco products for at least 6 hours prior to                 surgery.  ____  5.  Bring all medications with you on the day of surgery if instructed.   __X__  6.  Notify your doctor if there is any change in your medical condition      (cold, fever, infections).     Do not wear jewelry, make-up, hairpins, clips or nail polish. Do not wear lotions, powders, or perfumes. You may  NOT wear deodorant. Do not shave 48 hours prior to surgery. Men may shave face and neck. Do not bring valuables to the hospital.    [facility] is not responsible for any belongings or valuables.  Contacts, dentures or bridgework may not be worn into surgery. Leave your suitcase in the car. After surgery it may be brought to your room. For patients admitted to the hospital, discharge time is determined by your treatment  team.   Patients discharged the day of surgery will not be allowed to drive home.   Please read over the following fact sheets that you were given:             PREPARING FOR SURGERY               ADVANCE DIRECTIVES   ____ Take these medicines the morning of surgery with A SIP OF WATER:    1.EYE DROPS  2.   3.   4.  5.  6.  ____ Fleet Enema (as directed)   _X___ Use CHG Soap as directed  ____ Use inhalers on the day of surgery  _X___ Stop metformin 2 days prior to surgery. LAST DOSE ON 07/06/2018    ____ Take 1/2 of usual insulin dose the night before surgery. No insulin the morning          of surgery.   _X___ Stop ALL ASPIRIN PRODUCTS AS OF 07/02/2018  __X__ Stop Anti-inflammatories AS OF 07/02/2018               THIS INCLUDES IBUPROFEN / MOTRIN / ADVIL / ALEVE / INDOCIN   __X__ Stop supplements until after surgery.                 THIS INCLUDES VITAMIN B 12  ____ Bring C-Pap to the hospital.   CONTINUE USING VIBRAMYCIN AND ATORVASTATIN AS USUAL  WEAR A LARGE, LOOSE FITTING SHIRT TO THE HOSPITAL  HAVE STOOL SOFTENERS AT HOME TO USE AFTER SURGERY     I RECOMMEND THAT YOU START THIS A DAY OR TWO PRIOR TO SURGERY  IF YOU HAVE COMPLETED THE MEDICAL DIRECTIVES, PLEASE BRING WITH  YOU          SO WE MAY MAKE A COPY FOR YOUR CHART

## 2018-06-30 NOTE — Discharge Instructions (Signed)

## 2018-07-07 ENCOUNTER — Ambulatory Visit: Payer: Medicare Other | Admitting: Anesthesiology

## 2018-07-07 ENCOUNTER — Encounter
Admission: RE | Disposition: A | Discharge status: Home / Self Care | Payer: Self-pay | Source: Home / Self Care | Attending: Ophthalmology

## 2018-07-07 ENCOUNTER — Ambulatory Visit
Admission: RE | Admit: 2018-07-07 | Discharge: 2018-07-07 | Disposition: A | Discharge status: Home / Self Care | Payer: Medicare Other | Attending: Ophthalmology | Admitting: Ophthalmology

## 2018-07-07 DIAGNOSIS — E119 Type 2 diabetes mellitus without complications: Secondary | ICD-10-CM | POA: Insufficient documentation

## 2018-07-07 DIAGNOSIS — H2511 Age-related nuclear cataract, right eye: Secondary | ICD-10-CM | POA: Diagnosis not present

## 2018-07-07 DIAGNOSIS — G4730 Sleep apnea, unspecified: Secondary | ICD-10-CM | POA: Insufficient documentation

## 2018-07-07 DIAGNOSIS — J449 Chronic obstructive pulmonary disease, unspecified: Secondary | ICD-10-CM | POA: Diagnosis not present

## 2018-07-07 DIAGNOSIS — Z87891 Personal history of nicotine dependence: Secondary | ICD-10-CM | POA: Insufficient documentation

## 2018-07-07 HISTORY — PX: CATARACT EXTRACTION W/PHACO: SHX586

## 2018-07-07 LAB — GLUCOSE, CAPILLARY
GLUCOSE-CAPILLARY: 128 mg/dL — AB (ref 70–99)
Glucose-Capillary: 123 mg/dL — ABNORMAL HIGH (ref 70–99)

## 2018-07-07 SURGERY — PHACOEMULSIFICATION, CATARACT, WITH IOL INSERTION
Anesthesia: Monitor Anesthesia Care | Site: Eye | Laterality: Right

## 2018-07-07 MED ORDER — ACETAMINOPHEN 160 MG/5ML PO SOLN: 325.0000 mg | Freq: Once | ORAL | Status: DC

## 2018-07-07 MED ORDER — LIDOCAINE HCL (PF) 2 % IJ SOLN
INTRAOCULAR | Status: DC | PRN
  Administered 2018-07-07: .5 mL

## 2018-07-07 MED ORDER — LACTATED RINGERS IV SOLN: INTRAVENOUS | Status: DC

## 2018-07-07 MED ORDER — NA HYALUR & NA CHOND-NA HYALUR 0.4-0.35 ML IO KIT
PACK | INTRAOCULAR | Status: DC | PRN
  Administered 2018-07-07: 1 mL via INTRAOCULAR

## 2018-07-07 MED ORDER — ACETAMINOPHEN 325 MG PO TABS: 325.0000 mg | ORAL_TABLET | Freq: Once | ORAL | Status: DC

## 2018-07-07 MED ORDER — ARMC OPHTHALMIC DILATING DROPS
1.0000 "application " | OPHTHALMIC | Status: DC | PRN
  Administered 2018-07-07 (×3): 1 via OPHTHALMIC

## 2018-07-07 MED ORDER — CEFUROXIME OPHTHALMIC INJECTION 1 MG/0.1 ML
INJECTION | OPHTHALMIC | Status: DC | PRN
  Administered 2018-07-07: 0.1 mL via INTRACAMERAL

## 2018-07-07 MED ORDER — MIDAZOLAM HCL 2 MG/2ML IJ SOLN
INTRAMUSCULAR | Status: DC | PRN
  Administered 2018-07-07: 1 mg via INTRAVENOUS

## 2018-07-07 MED ORDER — EPINEPHRINE PF 1 MG/ML IJ SOLN
INTRAOCULAR | Status: DC | PRN
  Administered 2018-07-07: 62 mL via OPHTHALMIC

## 2018-07-07 MED ORDER — MOXIFLOXACIN HCL 0.5 % OP SOLN
1.0000 [drp] | OPHTHALMIC | Status: DC | PRN
  Administered 2018-07-07 (×3): 1 [drp] via OPHTHALMIC

## 2018-07-07 MED ORDER — TETRACAINE HCL 0.5 % OP SOLN
1.0000 [drp] | OPHTHALMIC | Status: DC | PRN
  Administered 2018-07-07 (×2): 1 [drp] via OPHTHALMIC

## 2018-07-07 MED ORDER — BRIMONIDINE TARTRATE-TIMOLOL 0.2-0.5 % OP SOLN
OPHTHALMIC | Status: DC | PRN
  Administered 2018-07-07: 1 [drp] via OPHTHALMIC

## 2018-07-07 SURGICAL SUPPLY — 26 items
CANNULA ANT/CHMB 27G (MISCELLANEOUS) ×1 IMPLANT
CANNULA ANT/CHMB 27GA (MISCELLANEOUS) ×3 IMPLANT
GLOVE SURG LX 7.5 STRW (GLOVE) ×2
GLOVE SURG LX STRL 7.5 STRW (GLOVE) ×1 IMPLANT
GLOVE SURG TRIUMPH 8.0 PF LTX (GLOVE) ×3 IMPLANT
GOWN STRL REUS W/ TWL LRG LVL3 (GOWN DISPOSABLE) ×2 IMPLANT
GOWN STRL REUS W/TWL LRG LVL3 (GOWN DISPOSABLE) ×4
LENS IOL MULTIFOCL TECNIS 22.5 (Intraocular Lens) ×2 IMPLANT
MARKER SKIN DUAL TIP RULER LAB (MISCELLANEOUS) ×3 IMPLANT
NDL FILTER BLUNT 18X1 1/2 (NEEDLE) ×1 IMPLANT
NDL RETROBULBAR .5 NSTRL (NEEDLE) IMPLANT
NEEDLE FILTER BLUNT 18X 1/2SAF (NEEDLE) ×2
NEEDLE FILTER BLUNT 18X1 1/2 (NEEDLE) ×1 IMPLANT
PACK CATARACT BRASINGTON (MISCELLANEOUS) ×3 IMPLANT
PACK EYE AFTER SURG (MISCELLANEOUS) ×3 IMPLANT
PACK OPTHALMIC (MISCELLANEOUS) ×3 IMPLANT
RING MALYGIN 7.0 (MISCELLANEOUS) ×2 IMPLANT
SUT ETHILON 10-0 CS-B-6CS-B-6 (SUTURE)
SUT VICRYL  9 0 (SUTURE)
SUT VICRYL 9 0 (SUTURE) IMPLANT
SUTURE EHLN 10-0 CS-B-6CS-B-6 (SUTURE) IMPLANT
SYR 3ML LL SCALE MARK (SYRINGE) ×3 IMPLANT
SYR 5ML LL (SYRINGE) ×3 IMPLANT
SYR TB 1ML LUER SLIP (SYRINGE) ×3 IMPLANT
WATER STERILE IRR 500ML POUR (IV SOLUTION) ×3 IMPLANT
WIPE NON LINTING 3.25X3.25 (MISCELLANEOUS) ×3 IMPLANT

## 2018-07-07 NOTE — H&P (Signed)

## 2018-07-07 NOTE — Transfer of Care (Signed)
Immediate Anesthesia Transfer of Care Note  Patient: Connor Miranda  Procedure(s) Performed: CATARACT EXTRACTION PHACO AND INTRAOCULAR LENS PLACEMENT (IOC)  COMPLICATED DIABETIC MULTIFOCAL LENS (Right Eye)  Patient Location: PACU  Anesthesia Type: MAC  Level of Consciousness: awake, alert  and patient cooperative  Airway and Oxygen Therapy: Patient Spontanous Breathing and Patient connected to supplemental oxygen  Post-op Assessment: Post-op Vital signs reviewed, Patient's Cardiovascular Status Stable, Respiratory Function Stable, Patent Airway and No signs of Nausea or vomiting  Post-op Vital Signs: Reviewed and stable  Complications: No apparent anesthesia complications

## 2018-07-07 NOTE — Op Note (Signed)
OPERATIVE NOTE  Connor Miranda [id number] 07/07/2018   PREOPERATIVE DIAGNOSIS:    Nuclear Sclerotic Cataract Right eye with miotic pupil.        H25.11  POSTOPERATIVE DIAGNOSIS: Nuclear Sclerotic Cataract Right eye with miotic pupil.          PROCEDURE:  Phacoemusification with posterior chamber intraocular lens placement of the right eye which required pupil stretching with the Malyugin pupil expansion device.  LENS:   Implant Name Type Inv. Item Serial No. Manufacturer Lot No. LRB No. Used  TECNIS MULTIFOCAL IOL LENS Intraocular Lens  [id number] [employer]  Right 1    [id number] 22.5 D Tecnis IOL with +2.75 D of add power   ULTRASOUND TIME: 17 % of 1 minutes 12 seconds, CDE 12.4  SURGEON:  Wyonia Hough, MD   ANESTHESIA:  Topical with tetracaine drops and 2% Xylocaine jelly, augmented with 1% preservative-free intracameral lidocaine.   COMPLICATIONS:  None.   DESCRIPTION OF PROCEDURE:  The patient was identified in the holding room and transported to the operating room and placed in the supine position under the operating microscope. Theright eye was identified as the operative eye and it was prepped and draped in the usual sterile ophthalmic fashion.   A 1 millimeter clear-corneal paracentesis was made at the 3:00 position.  0.5 ml of preservative-free 1% lidocaine was injected into the anterior chamber. The anterior chamber was filled with Viscoat viscoelastic.  A 2.4 millimeter keratome was used to make a near-clear corneal incision at the 12:00 position. A Malyugin pupil expander was then placed through the main incision and into the anterior chamber of the eye.  The edge of the iris was secured on the lip of the pupil expander and it was released, thereby expanding the pupil to approximately 7 millimeters for completion of the cataract surgery.  Additional Viscoat was placed in the anterior chamber.  A cystotome and capsulorrhexis forceps were used to make a  curvilinear capsulorrhexis.   Balanced salt solution was used to hydrodissect and hydrodelineate the lens nucleus.   Phacoemulsification was used in stop and chop fashion to remove the lens, nucleus and epinucleus.  The remaining cortex was aspirated using the irrigation aspiration handpiece.  Additional Provisc was placed into the eye to distend the capsular bag for lens placement.  A lens was then injected into the capsular bag.  The pupil expanding ring was removed using a Kuglen hook and insertion device. The remaining viscoelastic was aspirated from the capsular bag and the anterior chamber.  The anterior chamber was filled with balanced salt solution to inflate to a physiologic pressure.  Wounds were hydrated with balanced salt solution.  The anterior chamber was inflated to a physiologic pressure with balanced salt solution.  No wound leaks were noted.Cefuroxime 0.1 ml of a 10mg /ml solution was injected into the anterior chamber for a dose of 1 mg of intracameral antibiotic at the completion of the case. Timolol and Brimonidine drops were applied to the eye.  The patient was taken to the recovery room in stable condition without complications of anesthesia or surgery.  [name] 07/07/2018, 8:55 AM

## 2018-07-07 NOTE — Anesthesia Postprocedure Evaluation (Signed)
Anesthesia Post Note  Patient: Connor Miranda  Procedure(s) Performed: CATARACT EXTRACTION PHACO AND INTRAOCULAR LENS PLACEMENT (IOC)  COMPLICATED DIABETIC MULTIFOCAL LENS (Right Eye)  Patient location during evaluation: PACU Anesthesia Type: MAC Level of consciousness: awake and alert and oriented Pain management: satisfactory to patient Vital Signs Assessment: post-procedure vital signs reviewed and stable Respiratory status: spontaneous breathing, nonlabored ventilation and respiratory function stable Cardiovascular status: blood pressure returned to baseline and stable Postop Assessment: Adequate PO intake and No signs of nausea or vomiting Anesthetic complications: no    Raliegh Ip

## 2018-07-07 NOTE — Anesthesia Preprocedure Evaluation (Signed)
Anesthesia Evaluation  Patient identified by MRN, date of birth, ID band Patient awake    Reviewed: Allergy & Precautions, H&P , NPO status , Patient's Chart, lab work & pertinent test results  Airway Mallampati: II  TM Distance: >3 FB Neck ROM: full    Dental no notable dental hx. (+) Partial Upper   Pulmonary sleep apnea , COPD, former smoker,    Pulmonary exam normal breath sounds clear to auscultation       Cardiovascular Normal cardiovascular exam Rhythm:regular Rate:Normal     Neuro/Psych    GI/Hepatic   Endo/Other  diabetes, Oral Hypoglycemic Agents  Renal/GU      Musculoskeletal   Abdominal   Peds  Hematology   Anesthesia Other Findings   Reproductive/Obstetrics                             Anesthesia Physical Anesthesia Plan  ASA: III  Anesthesia Plan: MAC   Post-op Pain Management:    Induction:   PONV Risk Score and Plan: 1 and Midazolam and Treatment may vary due to age or medical condition  Airway Management Planned:   Additional Equipment:   Intra-op Plan:   Post-operative Plan:   Informed Consent: I have reviewed the patients History and Physical, chart, labs and discussed the procedure including the risks, benefits and alternatives for the proposed anesthesia with the patient or authorized representative who has indicated his/her understanding and acceptance.     Plan Discussed with: CRNA  Anesthesia Plan Comments:         Anesthesia Quick Evaluation

## 2018-07-08 ENCOUNTER — Encounter: Payer: Self-pay | Admitting: Ophthalmology

## 2018-07-08 MED ORDER — CEFAZOLIN SODIUM-DEXTROSE 2-4 GM/100ML-% IV SOLN
2.0000 g | Freq: Once | INTRAVENOUS | Status: AC
  Administered 2018-07-09: 2 g via INTRAVENOUS

## 2018-07-09 ENCOUNTER — Encounter: Payer: Self-pay | Admitting: *Deleted

## 2018-07-09 ENCOUNTER — Encounter
Admission: RE | Disposition: A | Discharge status: Home / Self Care | Payer: Self-pay | Source: Ambulatory Visit | Attending: Surgery

## 2018-07-09 ENCOUNTER — Other Ambulatory Visit: Payer: Self-pay

## 2018-07-09 ENCOUNTER — Ambulatory Visit
Admission: RE | Admit: 2018-07-09 | Discharge: 2018-07-09 | Disposition: A | Discharge status: Home / Self Care | Payer: Medicare Other | Source: Ambulatory Visit | Attending: Surgery | Admitting: Surgery

## 2018-07-09 ENCOUNTER — Ambulatory Visit: Payer: Medicare Other | Admitting: Anesthesiology

## 2018-07-09 DIAGNOSIS — Z79899 Other long term (current) drug therapy: Secondary | ICD-10-CM | POA: Diagnosis not present

## 2018-07-09 DIAGNOSIS — M94212 Chondromalacia, left shoulder: Secondary | ICD-10-CM | POA: Diagnosis not present

## 2018-07-09 DIAGNOSIS — Z87891 Personal history of nicotine dependence: Secondary | ICD-10-CM | POA: Diagnosis not present

## 2018-07-09 DIAGNOSIS — Z7984 Long term (current) use of oral hypoglycemic drugs: Secondary | ICD-10-CM | POA: Insufficient documentation

## 2018-07-09 DIAGNOSIS — J449 Chronic obstructive pulmonary disease, unspecified: Secondary | ICD-10-CM | POA: Insufficient documentation

## 2018-07-09 DIAGNOSIS — E785 Hyperlipidemia, unspecified: Secondary | ICD-10-CM | POA: Diagnosis not present

## 2018-07-09 DIAGNOSIS — M7522 Bicipital tendinitis, left shoulder: Secondary | ICD-10-CM | POA: Insufficient documentation

## 2018-07-09 DIAGNOSIS — G4730 Sleep apnea, unspecified: Secondary | ICD-10-CM | POA: Diagnosis not present

## 2018-07-09 DIAGNOSIS — E119 Type 2 diabetes mellitus without complications: Secondary | ICD-10-CM | POA: Insufficient documentation

## 2018-07-09 DIAGNOSIS — M7582 Other shoulder lesions, left shoulder: Secondary | ICD-10-CM | POA: Diagnosis present

## 2018-07-09 HISTORY — PX: SHOULDER ARTHROSCOPY WITH DEBRIDEMENT AND BICEP TENDON REPAIR: SHX5690

## 2018-07-09 LAB — GLUCOSE, CAPILLARY
GLUCOSE-CAPILLARY: 159 mg/dL — AB (ref 70–99)
Glucose-Capillary: 138 mg/dL — ABNORMAL HIGH (ref 70–99)

## 2018-07-09 SURGERY — SHOULDER ARTHROSCOPY WITH DEBRIDEMENT AND BICEP TENDON REPAIR
Anesthesia: General | Site: Shoulder | Laterality: Left

## 2018-07-09 MED ORDER — FENTANYL CITRATE (PF) 100 MCG/2ML IJ SOLN
INTRAMUSCULAR | Status: AC
  Filled 2018-07-09: qty 2

## 2018-07-09 MED ORDER — FAMOTIDINE 20 MG PO TABS
20.0000 mg | ORAL_TABLET | Freq: Once | ORAL | Status: AC
  Administered 2018-07-09: 20 mg via ORAL

## 2018-07-09 MED ORDER — FENTANYL CITRATE (PF) 100 MCG/2ML IJ SOLN
INTRAMUSCULAR | Status: DC | PRN
  Administered 2018-07-09: 100 ug via INTRAVENOUS

## 2018-07-09 MED ORDER — OXYCODONE HCL 5 MG PO TABS: 5.0000 mg | ORAL_TABLET | ORAL | 0 refills | Status: AC | PRN

## 2018-07-09 MED ORDER — MIDAZOLAM HCL 2 MG/2ML IJ SOLN
INTRAMUSCULAR | Status: AC
  Administered 2018-07-09: 1 mg via INTRAVENOUS
  Filled 2018-07-09: qty 2

## 2018-07-09 MED ORDER — LIDOCAINE HCL (PF) 1 % IJ SOLN
INTRAMUSCULAR | Status: AC
  Filled 2018-07-09: qty 5

## 2018-07-09 MED ORDER — SODIUM CHLORIDE 0.9 % IV SOLN
INTRAVENOUS | Status: DC
  Administered 2018-07-09: 09:00:00 via INTRAVENOUS

## 2018-07-09 MED ORDER — BUPIVACAINE HCL (PF) 0.5 % IJ SOLN
INTRAMUSCULAR | Status: AC
  Filled 2018-07-09: qty 10

## 2018-07-09 MED ORDER — MIDAZOLAM HCL 2 MG/2ML IJ SOLN
1.0000 mg | Freq: Once | INTRAMUSCULAR | Status: AC
  Administered 2018-07-09: 1 mg via INTRAVENOUS

## 2018-07-09 MED ORDER — OXYCODONE HCL 5 MG PO TABS
5.0000 mg | ORAL_TABLET | ORAL | Status: DC | PRN
  Administered 2018-07-09: 5 mg via ORAL
  Filled 2018-07-09 (×2): qty 2

## 2018-07-09 MED ORDER — OXYCODONE HCL 5 MG PO TABS
ORAL_TABLET | ORAL | Status: AC
  Filled 2018-07-09: qty 1

## 2018-07-09 MED ORDER — ONDANSETRON HCL 4 MG PO TABS: 4.0000 mg | ORAL_TABLET | Freq: Four times a day (QID) | ORAL | Status: DC | PRN

## 2018-07-09 MED ORDER — BUPIVACAINE HCL (PF) 0.5 % IJ SOLN
INTRAMUSCULAR | Status: DC | PRN
  Administered 2018-07-09: 10 mL via PERINEURAL

## 2018-07-09 MED ORDER — BUPIVACAINE LIPOSOME 1.3 % IJ SUSP
INTRAMUSCULAR | Status: DC | PRN
  Administered 2018-07-09: 20 mL via PERINEURAL

## 2018-07-09 MED ORDER — DEXAMETHASONE SODIUM PHOSPHATE 10 MG/ML IJ SOLN
INTRAMUSCULAR | Status: DC | PRN
  Administered 2018-07-09: 5 mg via INTRAVENOUS

## 2018-07-09 MED ORDER — FENTANYL CITRATE (PF) 100 MCG/2ML IJ SOLN: 25.0000 ug | INTRAMUSCULAR | Status: DC | PRN

## 2018-07-09 MED ORDER — ROCURONIUM BROMIDE 100 MG/10ML IV SOLN
INTRAVENOUS | Status: DC | PRN
  Administered 2018-07-09: 50 mg via INTRAVENOUS

## 2018-07-09 MED ORDER — LIDOCAINE HCL (PF) 1 % IJ SOLN
INTRAMUSCULAR | Status: DC | PRN
  Administered 2018-07-09: 5 mL via SUBCUTANEOUS

## 2018-07-09 MED ORDER — CEFAZOLIN SODIUM-DEXTROSE 2-4 GM/100ML-% IV SOLN
INTRAVENOUS | Status: AC
  Filled 2018-07-09: qty 100

## 2018-07-09 MED ORDER — METOCLOPRAMIDE HCL 10 MG PO TABS: 5.0000 mg | ORAL_TABLET | Freq: Three times a day (TID) | ORAL | Status: DC | PRN

## 2018-07-09 MED ORDER — LIDOCAINE HCL (CARDIAC) PF 100 MG/5ML IV SOSY
PREFILLED_SYRINGE | INTRAVENOUS | Status: DC | PRN
  Administered 2018-07-09: 100 mg via INTRAVENOUS

## 2018-07-09 MED ORDER — PROPOFOL 10 MG/ML IV BOLUS
INTRAVENOUS | Status: DC | PRN
  Administered 2018-07-09: 150 mg via INTRAVENOUS

## 2018-07-09 MED ORDER — BUPIVACAINE-EPINEPHRINE (PF) 0.5% -1:200000 IJ SOLN
INTRAMUSCULAR | Status: AC
  Filled 2018-07-09: qty 90

## 2018-07-09 MED ORDER — FENTANYL CITRATE (PF) 100 MCG/2ML IJ SOLN
50.0000 ug | Freq: Once | INTRAMUSCULAR | Status: AC
  Administered 2018-07-09: 50 ug via INTRAVENOUS

## 2018-07-09 MED ORDER — LACTATED RINGERS IV SOLN
INTRAVENOUS | Status: DC | PRN
  Administered 2018-07-09: 10:00:00 via INTRAVENOUS

## 2018-07-09 MED ORDER — ONDANSETRON HCL 4 MG/2ML IJ SOLN: 4.0000 mg | Freq: Once | INTRAMUSCULAR | Status: DC | PRN

## 2018-07-09 MED ORDER — ONDANSETRON HCL 4 MG/2ML IJ SOLN
INTRAMUSCULAR | Status: DC | PRN
  Administered 2018-07-09: 4 mg via INTRAVENOUS

## 2018-07-09 MED ORDER — ONDANSETRON HCL 4 MG/2ML IJ SOLN: 4.0000 mg | Freq: Four times a day (QID) | INTRAMUSCULAR | Status: DC | PRN

## 2018-07-09 MED ORDER — SUGAMMADEX SODIUM 200 MG/2ML IV SOLN
INTRAVENOUS | Status: DC | PRN
  Administered 2018-07-09: 100 mg via INTRAVENOUS

## 2018-07-09 MED ORDER — OXYCODONE HCL 5 MG PO TABS: 5.0000 mg | ORAL_TABLET | ORAL | Status: DC | PRN

## 2018-07-09 MED ORDER — METOCLOPRAMIDE HCL 5 MG/ML IJ SOLN: 5.0000 mg | Freq: Three times a day (TID) | INTRAMUSCULAR | Status: DC | PRN

## 2018-07-09 MED ORDER — POTASSIUM CHLORIDE IN NACL 20-0.9 MEQ/L-% IV SOLN
INTRAVENOUS | Status: DC
  Filled 2018-07-09 (×3): qty 1000

## 2018-07-09 MED ORDER — BUPIVACAINE LIPOSOME 1.3 % IJ SUSP
INTRAMUSCULAR | Status: AC
  Filled 2018-07-09: qty 20

## 2018-07-09 MED ORDER — FENTANYL CITRATE (PF) 100 MCG/2ML IJ SOLN
INTRAMUSCULAR | Status: AC
  Administered 2018-07-09: 50 ug via INTRAVENOUS
  Filled 2018-07-09: qty 2

## 2018-07-09 MED ORDER — FAMOTIDINE 20 MG PO TABS
ORAL_TABLET | ORAL | Status: AC
  Administered 2018-07-09: 20 mg via ORAL
  Filled 2018-07-09: qty 1

## 2018-07-09 MED ORDER — EPINEPHRINE PF 1 MG/ML IJ SOLN
INTRAMUSCULAR | Status: DC | PRN
  Administered 2018-07-09: 1 mg

## 2018-07-09 MED ORDER — PROPOFOL 10 MG/ML IV BOLUS
INTRAVENOUS | Status: AC
  Filled 2018-07-09: qty 20

## 2018-07-09 SURGICAL SUPPLY — 44 items
ANCHOR JUGGERKNOT WTAP NDL 2.9 (Anchor) ×2 IMPLANT
BIT DRILL JUGRKNT W/NDL BIT2.9 (DRILL) IMPLANT
BLADE FULL RADIUS 3.5 (BLADE) ×3 IMPLANT
BUR ACROMIONIZER 4.0 (BURR) ×3 IMPLANT
CANNULA SHAVER 8MMX76MM (CANNULA) ×3 IMPLANT
CHLORAPREP W/TINT 26ML (MISCELLANEOUS) ×3 IMPLANT
COVER MAYO STAND STRL (DRAPES) IMPLANT
COVER WAND RF STERILE (DRAPES) ×1 IMPLANT
DRAPE IMP U-DRAPE 54X76 (DRAPES) ×6 IMPLANT
DRILL JUGGERKNOT W/NDL BIT 2.9 (DRILL) ×3
ELECT REM PT RETURN 9FT ADLT (ELECTROSURGICAL) ×3
ELECTRODE REM PT RTRN 9FT ADLT (ELECTROSURGICAL) ×1 IMPLANT
GAUZE PETRO XEROFOAM 1X8 (MISCELLANEOUS) ×3 IMPLANT
GAUZE SPONGE 4X4 12PLY STRL (GAUZE/BANDAGES/DRESSINGS) ×3 IMPLANT
GLOVE BIO SURGEON STRL SZ7.5 (GLOVE) ×6 IMPLANT
GLOVE BIO SURGEON STRL SZ8 (GLOVE) ×6 IMPLANT
GLOVE BIOGEL PI IND STRL 8 (GLOVE) ×1 IMPLANT
GLOVE BIOGEL PI INDICATOR 8 (GLOVE) ×2
GLOVE INDICATOR 8.0 STRL GRN (GLOVE) ×3 IMPLANT
GOWN STRL REUS W/ TWL LRG LVL3 (GOWN DISPOSABLE) ×1 IMPLANT
GOWN STRL REUS W/ TWL XL LVL3 (GOWN DISPOSABLE) ×1 IMPLANT
GOWN STRL REUS W/TWL LRG LVL3 (GOWN DISPOSABLE) ×2
GOWN STRL REUS W/TWL XL LVL3 (GOWN DISPOSABLE) ×2
GRASPER SUT 15 45D LOW PRO (SUTURE) IMPLANT
IV LACTATED RINGER IRRG 3000ML (IV SOLUTION) ×2
IV LR IRRIG 3000ML ARTHROMATIC (IV SOLUTION) ×1 IMPLANT
MANIFOLD NEPTUNE II (INSTRUMENTS) ×3 IMPLANT
MASK FACE SPIDER DISP (MASK) ×3 IMPLANT
MAT ABSORB  FLUID 56X50 GRAY (MISCELLANEOUS) ×2
MAT ABSORB FLUID 56X50 GRAY (MISCELLANEOUS) ×1 IMPLANT
NEEDLE HYPO 22GX1.5 SAFETY (NEEDLE) ×3 IMPLANT
PACK ARTHROSCOPY SHOULDER (MISCELLANEOUS) ×3 IMPLANT
SLING ARM LRG DEEP (SOFTGOODS) ×1 IMPLANT
SLING ULTRA II LG (MISCELLANEOUS) ×3 IMPLANT
STAPLER SKIN PROX 35W (STAPLE) ×3 IMPLANT
STRAP SAFETY 5IN WIDE (MISCELLANEOUS) ×3 IMPLANT
SUT ETHIBOND 0 MO6 C/R (SUTURE) ×3 IMPLANT
SUT VIC AB 2-0 CT1 27 (SUTURE) ×4
SUT VIC AB 2-0 CT1 TAPERPNT 27 (SUTURE) ×2 IMPLANT
TAPE MICROFOAM 4IN (TAPE) ×3 IMPLANT
TUBING ARTHRO INFLOW-ONLY STRL (TUBING) ×3 IMPLANT
TUBING CONNECTING 10 (TUBING) ×2 IMPLANT
TUBING CONNECTING 10' (TUBING) ×1
WAND WEREWOLF FLOW 90D (MISCELLANEOUS) ×3 IMPLANT

## 2018-07-09 NOTE — OR Nursing (Signed)
Area of irritation noted to axillary area on left side after shave prep. No open skin noted,  area raised and red only. Dr Leslye Peer made aware, order to proceed with block.

## 2018-07-09 NOTE — Op Note (Signed)
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  Post-Op Diagnosis:   Same  Procedure:   Extensive arthroscopic debridement, arthroscopic subacromial decompression, mini-open biceps tenodesis, and manipulation under anesthesia, left shoulder.  Surgeon:   Pascal Lux, MD  Assistant:   None  Anesthesia:   GET with interscalene block using Exparel placed preoperatively by anesthesiologist.  Findings:   As above. Prior to manipulation, the left shoulder could be forward flexed to 150 and abducted to 140. At 90 of abduction, the shoulder could be externally rotated to 70 and internally rotated to 50. Following manipulation, the shoulder could be forward flexed to 165, abducted to 160 and, at 90 of abduction, externally rotated to 90 and internally rotated to 65.  There was moderate fraying of the labrum anteriorly and superiorly without frank detachment from the glenoid.  The biceps tendon demonstrated evidence of "lip sticking" without partial or full-thickness tearing.  There were grade 1-2 chondromalacial changes involving the central portion of the glenoid, but the remainder of the articular surfaces were in satisfactory condition.  The rotator cuff was in excellent condition.  Complications:   None  EBL:   15 cc  Fluids:   750 cc crystalloid  TT:   None  Drains:   None  Closure:   None  Brief Clinical Note:   The patient is a 73 year old male with a several month history of progressively worsening left shoulder pain and limited motion. The patient's symptoms have progressed despite medications, activity modification, etc. The patient's history and examination are consistent with impingement/tendinopathy with a possible element of adhesive capsulitis. A preoperative MRI scan showed no evidence for rotator cuff pathology, but did demonstrate  evidence of impingement as well as of biceps tendinitis. The patient presents at this time for definitive management of these shoulder symptoms.  Procedure:   The patient underwent placement of an interscalene block using Exparel in the preoperative holding area before being brought into the operating room and lain in the supine position. After adequate general endotracheal intubation and anesthesia was achieved, the patient was repositioned in the beach chair position using the beach chair positioner. The left shoulder was gently manipulated in both abduction and external rotation, as well as adduction and internal rotation. Several palpable and audible pops were heard as the scar tissue released, permitting full range of motion of the shoulder.   The left shoulder and upper extremity were prepped with ChloraPrep solution before being draped sterilely. Preoperative antibiotics were administered. A timeout was performed to confirm the proper surgical site before the expected portal sites and incision site were injected with 0.5% Sensorcaine with epinephrine. A posterior portal was created and the glenohumeral joint thoroughly inspected with the findings as described above. An anterior portal was created using an outside-in technique. The labrum and rotator cuff were further probed, again confirming the above-noted findings. The areas of labral fraying were debrided back to stable margins using the full-radius resector. In addition, areas of synovitis also were debrided back to stable margins using the full-radius resector. The ArthroCare wand was inserted and used to release the biceps tendon from its labral anchor. It also was used to debride the rotator interval, as well as to "anneal" the labrum superiorly and anteriorly. The instruments were removed from the joint after suctioning the excess fluid.  The camera was repositioned through the posterior portal into the subacromial  space. A separate lateral  portal was created using an outside-in technique. The 3.5 mm full-radius resector was introduced and used to perform a subtotal bursectomy. The ArthroCare wand was then inserted and used to remove the periosteal tissue off the undersurface of the anterior third of the acromion as well as to recess the coracoacromial ligament from its attachment along the anterior and lateral margins of the acromion. The 4.0 mm acromionizing bur was introduced and used to complete the decompression by removing the undersurface of the anterior third of the acromion. The full radius resector was reintroduced to remove any residual bony debris before the ArthroCare wand was reintroduced to obtain hemostasis. The instruments were then removed from the subacromial space after suctioning the excess fluid.  An approximately 4-5 cm incision was made over the anterolateral aspect of the shoulder beginning at the anterolateral corner of the acromion and extending distally in line with the bicipital groove. This incision was carried down through the subcutaneous tissues to expose the deltoid fascia. The raphae between the anterior and middle thirds was identified and this plane developed to provide access into the subacromial space. Additional bursal tissues were debrided sharply using Metzenbaum scissors. The rotator cuff tear was carefully inspected and found to be intact.  The bicipital groove was identified by palpation and opened for 1-1.5 cm. The biceps tendon stump was retrieved through this defect. The floor of the bicipital groove was roughened with a curet before a a single Biomet 2.9 mm JuggerKnot anchor was inserted. Both sets of sutures were passed through the biceps tendon and tied securely to effect the tenodesis. The bicipital sheath was reapproximated using two #0 Ethibond interrupted sutures, incorporating the biceps tendon to further reinforce the tenodesis.  The wound was copiously irrigated with sterile saline  solution before the deltoid raphae was reapproximated using 2-0 Vicryl interrupted sutures. The subcutaneous tissues were closed in two layers using 2-0 Vicryl interrupted sutures before the skin was closed using staples. The portal sites also were closed using staples. A sterile bulky dressing was applied to the shoulder before the arm was placed into a shoulder immobilizer. The patient was then awakened, extubated, and returned to the recovery room in satisfactory condition after tolerating the procedure well.

## 2018-07-09 NOTE — Anesthesia Preprocedure Evaluation (Signed)
Anesthesia Evaluation  Patient identified by MRN, date of birth, ID band Patient awake    Reviewed: Allergy & Precautions, H&P , NPO status , Patient's Chart, lab work & pertinent test results, reviewed documented beta blocker date and time   History of Anesthesia Complications Negative for: history of anesthetic complications  Airway Mallampati: II  TM Distance: >3 FB Neck ROM: full    Dental  (+) Implants, Dental Advidsory Given, Partial Upper, Missing   Pulmonary neg shortness of breath, sleep apnea , COPD, neg recent URI, former smoker,           Cardiovascular Exercise Tolerance: Good negative cardio ROS       Neuro/Psych negative neurological ROS  negative psych ROS   GI/Hepatic negative GI ROS, Neg liver ROS,   Endo/Other  negative endocrine ROSdiabetes  Renal/GU negative Renal ROS  negative genitourinary   Musculoskeletal   Abdominal   Peds  Hematology negative hematology ROS (+)   Anesthesia Other Findings Past Medical History: No date: Allergic state 05/2018: Anemia     Comment:  Vitamini B12 deficiency No date: Colon polyps No date: COPD (chronic obstructive pulmonary disease) (HCC) No date: Degenerative arthritis of shoulder region No date: Glucose intolerance (impaired glucose tolerance) No date: History of chicken pox No date: History of gout No date: Hyperlipidemia No date: Osteoarthritis 05/2018: Sleep apnea     Comment:  just had sleep study 1 week ago. unsure of results as of              today No date: Type 2 diabetes mellitus ([place]) No date: Umbilical hernia   Reproductive/Obstetrics negative OB ROS                             Anesthesia Physical Anesthesia Plan  ASA: II  Anesthesia Plan: General   Post-op Pain Management:  Regional for Post-op pain   Induction: Intravenous  PONV Risk Score and Plan: 2 and Ondansetron, Dexamethasone and Treatment may  vary due to age or medical condition  Airway Management Planned: Oral ETT  Additional Equipment:   Intra-op Plan:   Post-operative Plan: Extubation in OR  Informed Consent: I have reviewed the patients History and Physical, chart, labs and discussed the procedure including the risks, benefits and alternatives for the proposed anesthesia with the patient or authorized representative who has indicated his/her understanding and acceptance.   Dental Advisory Given  Plan Discussed with: Anesthesiologist, CRNA and Surgeon  Anesthesia Plan Comments:         Anesthesia Quick Evaluation

## 2018-07-09 NOTE — Anesthesia Post-op Follow-up Note (Signed)
Anesthesia QCDR form completed.        

## 2018-07-09 NOTE — Anesthesia Procedure Notes (Signed)
Anesthesia Regional Block: Interscalene brachial plexus block   Pre-Anesthetic Checklist: ,, timeout performed, Correct Patient, Correct Site, Correct Laterality, Correct Procedure, Correct Position, site marked, Risks and benefits discussed,  Surgical consent,  Pre-op evaluation,  At surgeon's request and post-op pain management  Laterality: Right and Upper  Prep: chloraprep       Needles:  Injection technique: Single-shot  Needle Type: Stimiplex     Needle Length: 5cm  Needle Gauge: 22     Additional Needles:   Procedures:,,,, ultrasound used (permanent image in chart),,,,  Narrative:  Start time: 07/09/2018 9:45 AM End time: 07/09/2018 9:49 AM Injection made incrementally with aspirations every 5 mL.  Performed by: Personally  Anesthesiologist: Martha Clan, MD  Additional Notes: Functioning IV was confirmed and monitors were applied.  A 39mm 22ga Stimuplex needle was used. Sterile prep and drape,hand hygiene and sterile gloves were used.  Negative aspiration and negative test dose prior to incremental administration of local anesthetic. The patient tolerated the procedure well.

## 2018-07-09 NOTE — Anesthesia Procedure Notes (Signed)
Procedure Name: Intubation Performed by: Marcy Siren, CRNA Pre-anesthesia Checklist: Patient identified, Emergency Drugs available, Suction available, Patient being monitored and Timeout performed Patient Re-evaluated:Patient Re-evaluated prior to induction Oxygen Delivery Method: Circle system utilized Preoxygenation: Pre-oxygenation with 100% oxygen Induction Type: IV induction Ventilation: Mask ventilation without difficulty Laryngoscope Size: Mac and 3 Grade View: Grade II Tube type: Oral Tube size: 7.0 mm Number of attempts: 1 Placement Confirmation: ETT inserted through vocal cords under direct vision,  positive ETCO2,  CO2 detector and breath sounds checked- equal and bilateral Secured at: 22 cm Tube secured with: Tape Dental Injury: Teeth and Oropharynx as per pre-operative assessment

## 2018-07-09 NOTE — Discharge Instructions (Addendum)
Orthopedic discharge instructions: Keep dressing dry and intact.  May shower after dressing changed on post-op day #4 (Monday).  Cover staples with Band-Aids after drying off. Apply ice frequently to shoulder. Take ibuprofen 800 mg TID with meals for 7-10 days, then as necessary. Take oxycodone as prescribed when needed.  May supplement with ES Tylenol if necessary. Keep shoulder immobilizer on at all times except may remove for bathing purposes. Follow-up in 10-14 days or as scheduled.  AMBULATORY SURGERY  DISCHARGE INSTRUCTIONS   1) The drugs that you were given will stay in your system until tomorrow so for the next 24 hours you should not:  A) Drive an automobile B) Make any legal decisions C) Drink any alcoholic beverage   2) You may resume regular meals tomorrow.  Today it is better to start with liquids and gradually work up to solid foods.  You may eat anything you prefer, but it is better to start with liquids, then soup and crackers, and gradually work up to solid foods.   3) Please notify your doctor immediately if you have any unusual bleeding, trouble breathing, redness and pain at the surgery site, drainage, fever, or pain not relieved by medication.    4) Additional Instructions:        Please contact your physician with any problems or Same Day Surgery at [phone], Monday through Friday 6 am to 4 pm, or [place] at [facility] number at [phone].   Interscalene Nerve Block, Care After This sheet gives you information about how to care for yourself after your procedure. Your health care provider may also give you more specific instructions. If you have problems or questions, contact your health care provider. What can I expect after the procedure? After the procedure, it is common to have:  Soreness or tenderness in your neck.  Numbness in your shoulder, upper arm, and some fingers.  Weakness in your shoulder and arm muscles. The feeling  and strength in your shoulder, arm, and fingers should return to normal within hours after your procedure. Follow these instructions at home: For at least 24 hours after the procedure:  Do not: ? Participate in activities in which you could fall or become injured. ? Drive. ? Use heavy machinery. ? Drink alcohol. ? Take sleeping pills or medicines that cause drowsiness. ? Make important decisions or sign legal documents. ? Take care of children on your own.  Rest. Eating and drinking  If you vomit, drink water, juice, or soup when you can drink without vomiting.  Make sure you have little or no nausea before eating solid foods.  Follow the diet that is recommended by your health care provider. If you have a sling:  Wear it as told by your health care provider. Remove it only as told by your health care provider.  Loosen the sling if your fingers tingle, become numb, or turn cold and blue.  Make sure that your entire arm, including your wrist, is supported. Do not allow your wrist to dangle over the end of the sling.  Do not let your sling get wet if it is not waterproof.  Keep the sling clean. Bathing  Do not take baths, swim, or use a hot tub until your health care provider approves.  If you have a nerve block catheter in place, keep the incision site and tubing dry. Injection site care   Wash your hands with soap and water before you change your bandage (dressing). If soap and water  are not available, use hand sanitizer.  Change your dressing as told by your health care provider.  Keep your dressing dry.  Check your nerve block injection site every day for signs of infection. Check for: ? Redness, swelling, or pain. ? Fluid or blood. ? Warmth. Activity  Do not perform complex or risky activities while taking prescription pain medicine and until you have fully recovered.  Return to your normal activities as told by your health care provider and as you can  tolerate them. Ask your health care provider what activities are safe for you.  Rest and take it easy. This will help you heal and recover more quickly and fully.  Be very cautious until you have regained strength and sensation. General instructions  Have a responsible adult stay with you until you are awake and alert.  Do not drive or use heavy machinery while taking prescription pain medicine and until you have fully recovered. Ask your health care provider when it is safe to drive.  Take over-the-counter and prescription medicines only as told by your health care provider.  If you smoke, do not smoke without supervision.  Do not expose your arm or shoulder to very cold or very hot temperatures until you have full feeling back.  If you have a nerve block catheter in place: ? Try to keep the catheter from getting kinked or pinched. ? Avoid pulling or tugging on the catheter.  Keep all follow-up visits as told by your health care provider. This is important. Contact a health care provider if:  You have chills or fever.  You have redness, swelling, or pain around your injection site.  You have fluid or blood coming from the injection site.  The skin around the injection site is warm to the touch.  There is a bad smell coming from your dressing.  You have hoarseness or a drooping or dry eye that lasts more than a few days.  You have pain that is poorly controlled with the block or with pain medicine.  You have numbness, tingling, or weakness in your shoulder or arm that lasts for more than one week. Get help right away if:  You have severe pain.  You lose or do not regain strength and sensation in your arm even after the nerve block medicine has stopped.  You have trouble breathing.  You have a nerve block catheter still in place and you begin to shiver.  You have a nerve block catheter still in place and you are getting more and more numb or weak. This information is  not intended to replace advice given to you by your health care provider. Make sure you discuss any questions you have with your health care provider. Document Released: 06/09/2015 Document Revised: 02/16/2016 Document Reviewed: 02/16/2016 Elsevier Interactive Patient Education  2019 [employer].

## 2018-07-09 NOTE — Anesthesia Postprocedure Evaluation (Signed)
Anesthesia Post Note  Patient: Connor Miranda  Procedure(s) Performed: SHOULDER ARTHROSCOPY WITH DEBRIDEMENT , DECOMPRESSION,  BICEP TENDESIS AND MANIPULATION UNDER ANESTHESIA (Left Shoulder)  Patient location during evaluation: PACU Anesthesia Type: General Level of consciousness: awake and alert Pain management: pain level controlled Vital Signs Assessment: post-procedure vital signs reviewed and stable Respiratory status: spontaneous breathing, nonlabored ventilation, respiratory function stable and patient connected to nasal cannula oxygen Cardiovascular status: blood pressure returned to baseline and stable Postop Assessment: no apparent nausea or vomiting Anesthetic complications: no     Last Vitals:  Vitals:   07/09/18 1334 07/09/18 1501  BP: (!) 134/58 (!) 145/75  Pulse: 63 (!) 59  Resp: 16 18  Temp: (!) 36.3 C   SpO2: 100% 100%    Last Pain:  Vitals:   07/09/18 1501  TempSrc:   PainSc: 1                  Martha Clan

## 2018-07-09 NOTE — H&P (Signed)
Paper H&P to be scanned into permanent record. H&P reviewed and patient re-examined. No changes. 

## 2018-07-09 NOTE — Transfer of Care (Signed)
Immediate Anesthesia Transfer of Care Note  Patient: Connor Miranda  Procedure(s) Performed: SHOULDER ARTHROSCOPY WITH DEBRIDEMENT , DECOMPRESSION,  BICEP TENDESIS AND MANIPULATION UNDER ANESTHESIA (Left Shoulder)  Patient Location: PACU  Anesthesia Type:General  Level of Consciousness: awake  Airway & Oxygen Therapy: Patient Spontanous Breathing  Post-op Assessment: Report given to RN  Post vital signs: stable  Last Vitals:  Vitals Value Taken Time  BP    Temp    Pulse 138 07/09/2018 12:19 PM  Resp    SpO2 100 % 07/09/2018 12:19 PM  Vitals shown include unvalidated device data.  Last Pain:  Vitals:   07/09/18 0834  TempSrc: Oral  PainSc: 0-No pain         Complications: No apparent anesthesia complications

## 2018-07-10 ENCOUNTER — Encounter: Payer: Self-pay | Admitting: Surgery

## 2018-07-13 NOTE — Addendum Note (Signed)
Addendum  created 07/13/18 1246 by Doreen Salvage, CRNA   Charge Capture section accepted

## 2020-10-09 IMAGING — MR MR SHOULDER*L* W/O CM
5 series · 32 of 40 positions shown · non-contrast
Comparison: None.

CLINICAL DATA: Left shoulder pain for 4-6 months.  No known injury.

EXAM:
MRI OF THE LEFT SHOULDER WITHOUT CONTRAST
TECHNIQUE: Multiplanar, multisequence MR imaging of the shoulder was performed.
No intravenous contrast was administered.

[Series 10: PD fat-sat · axial · left · 4.0mm · 0.55mm/px · z∈[-75,+64]mm · 8 of 30 slices shown (1 of 2)]
[im 1/30]
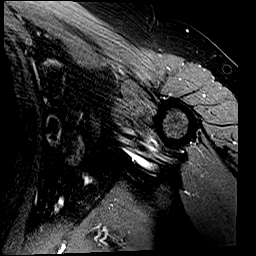
[im 4/30]
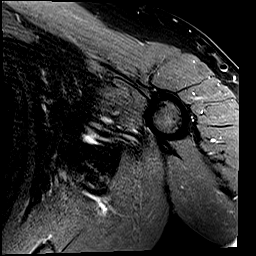
[im 10/30]
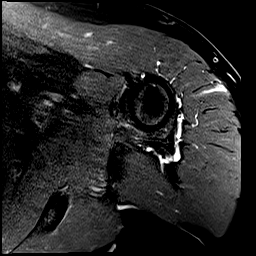
[im 13/30]
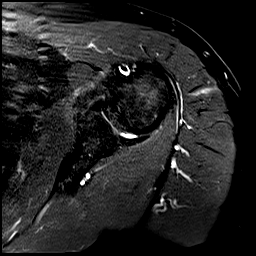
[im 17/30]
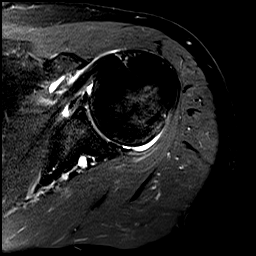
[im 20/30]
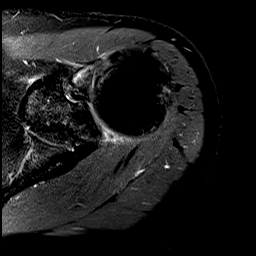
[im 26/30]
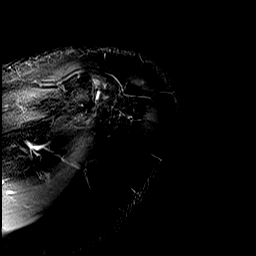
[im 30/30]
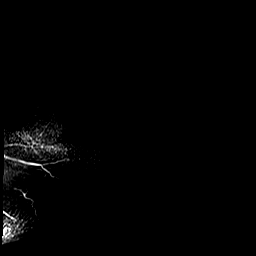

[Series 11: PD fat-sat · oblique · left · 4.0mm · 0.44mm/px · 8 of 26 slices shown (2 of 2)]
[im 1/26]
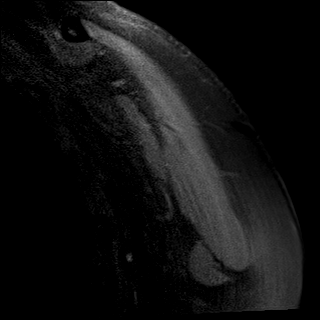
[im 4/26]
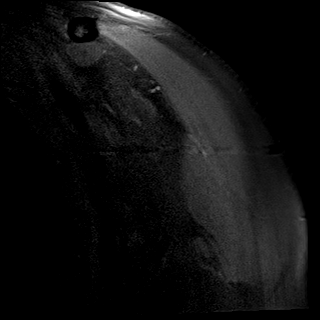
[im 8/26]
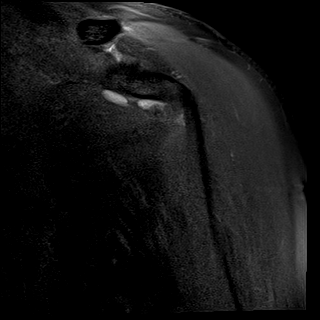
[im 11/26]
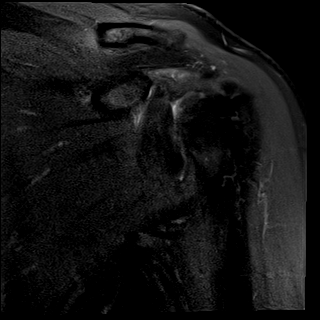
[im 15/26]
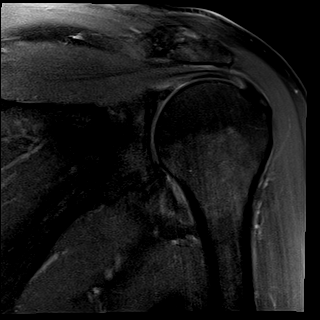
[im 18/26]
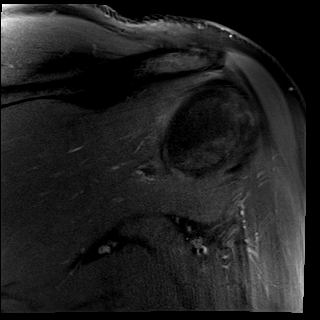
[im 22/26]
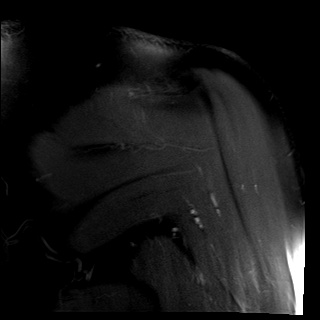
[im 26/26]
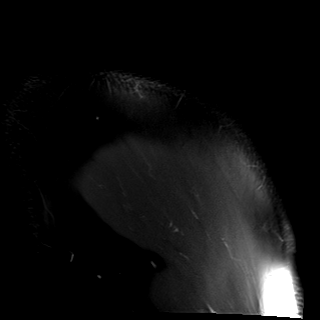

[Series 12: T2 fat-sat · oblique · left · 4.0mm · 0.44mm/px · 8 of 26 slices shown (1 of 2)]
[im 1/26]
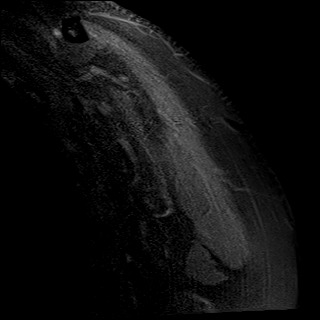
[im 4/26]
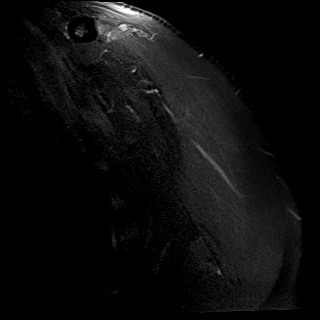
[im 8/26]
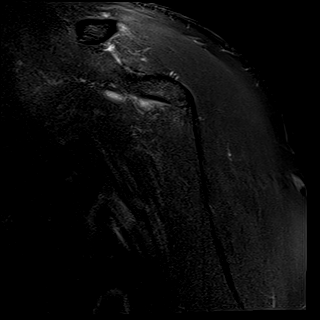
[im 11/26]
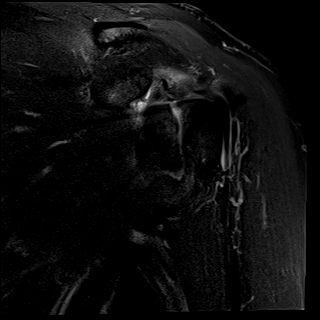
[im 15/26]
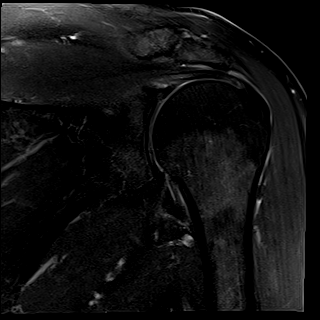
[im 18/26]
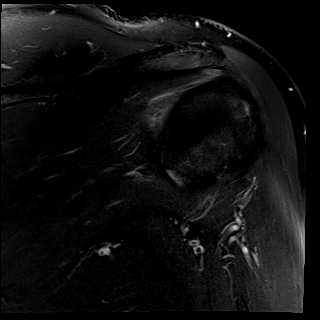
[im 22/26]
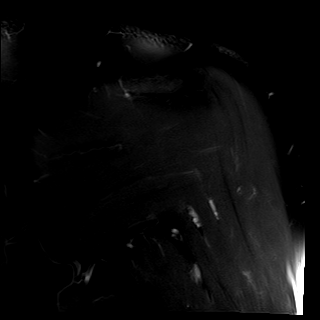
[im 26/26]
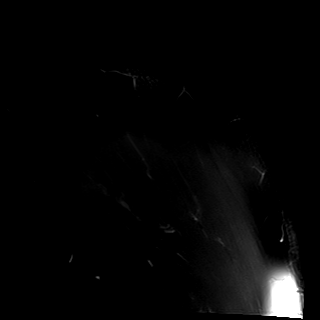

[Series 13: T2 fat-sat · oblique · left · 4.0mm · 0.23mm/px · 7 of 22 slices shown (2 of 2)]
[im 1/22]
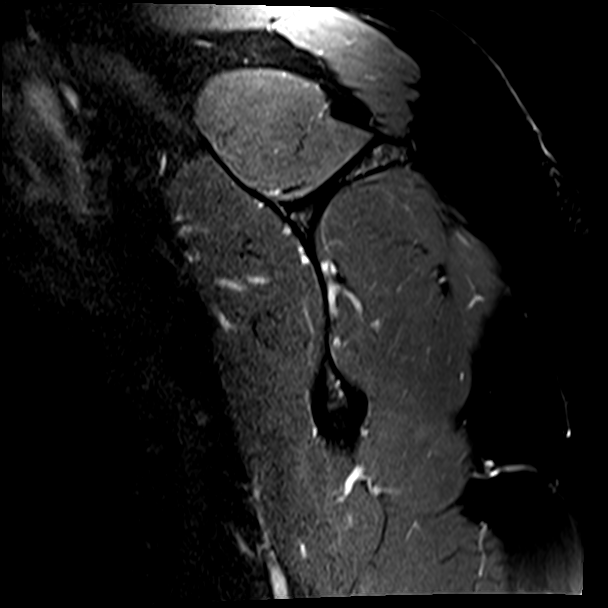
[im 4/22]
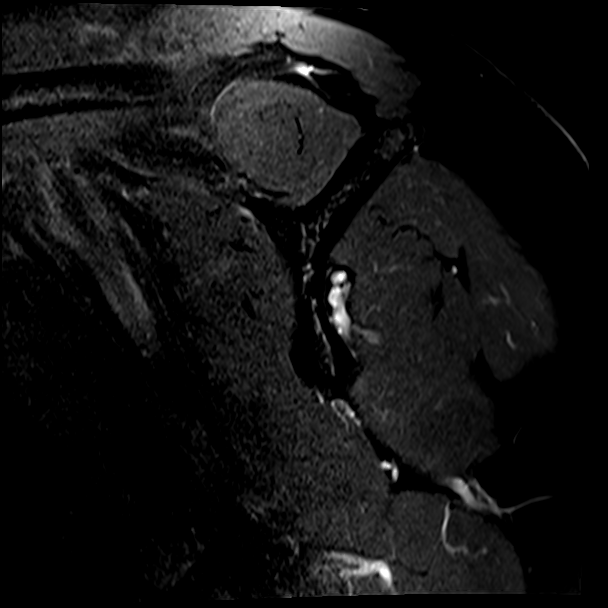
[im 8/22]
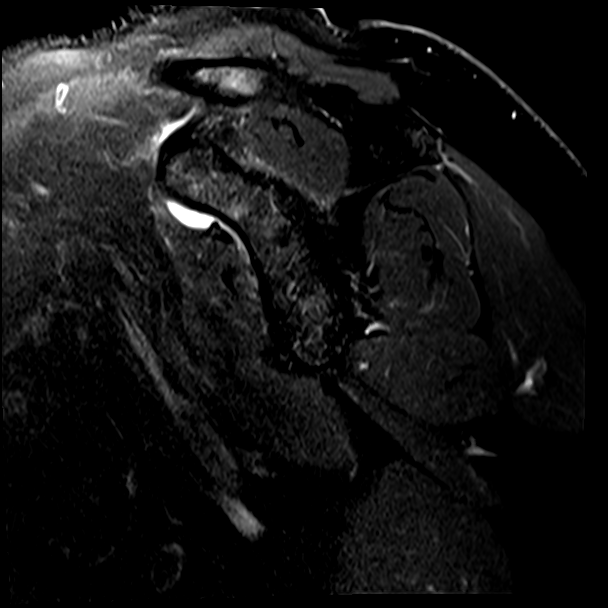
[im 11/22]
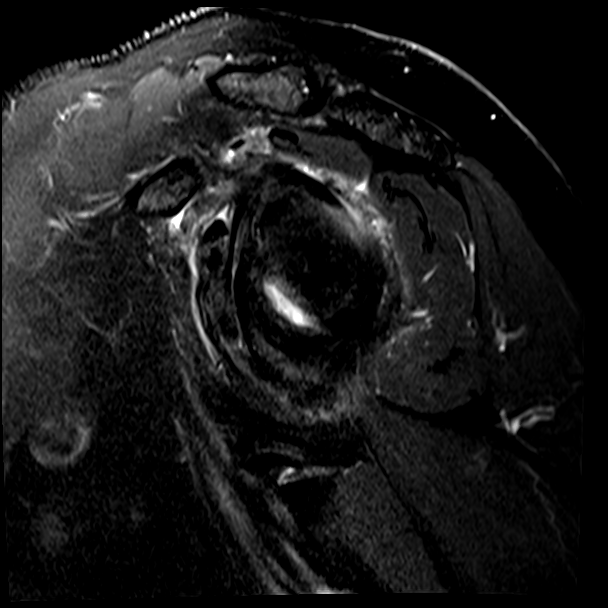
[im 15/22]
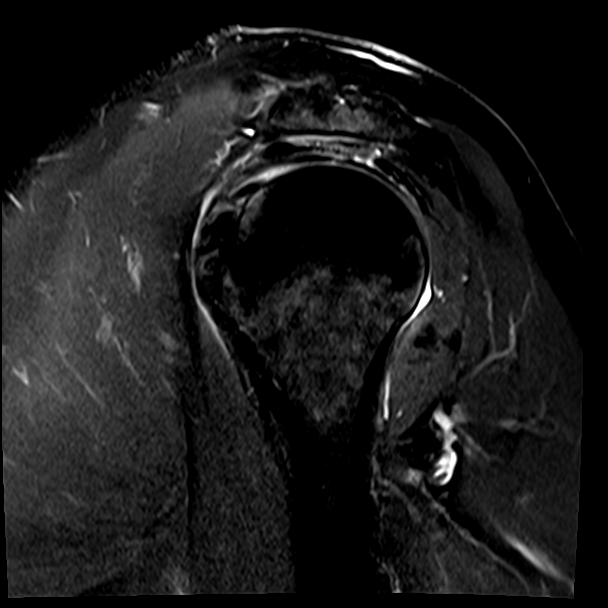
[im 18/22]
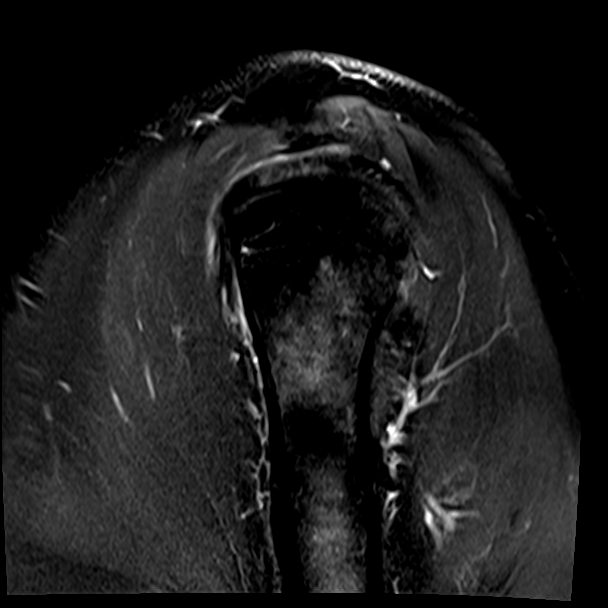
[im 22/22]
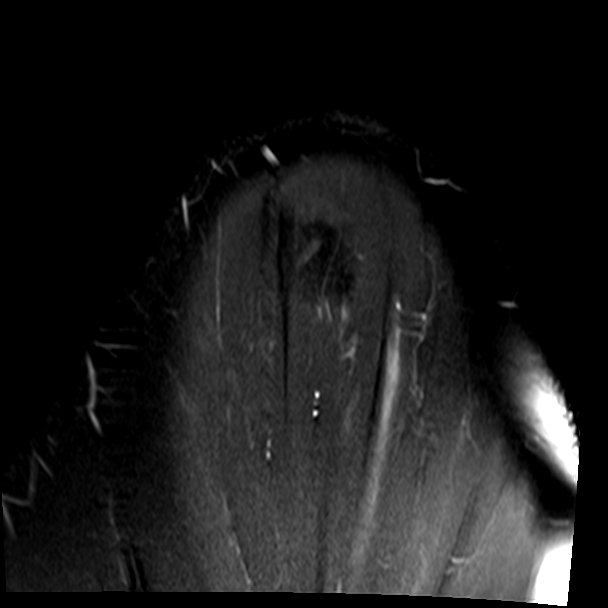

[Series 14: T1 · oblique · left · 4.0mm · 0.36mm/px · 1 of 22 slices shown]
[im 1/22]
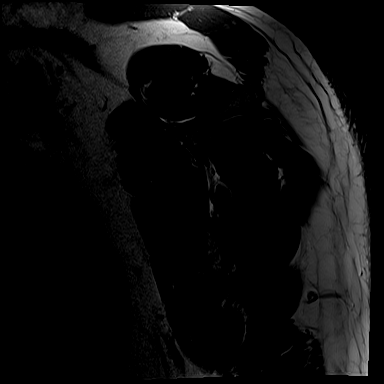

[32 of 40 positions shown; findings below may reference images not displayed]

FINDINGS: Rotator cuff: Mild. Heterogeneously increased T2 signal in the
rotator cuff tendons consistent with tendinopathy is identified. No
tear.

Muscles:  Normal without atrophy or focal lesion.

Biceps long head: Tendinopathy of the intra-articular segment
without tear is seen.

Acromioclavicular Joint: Mild degenerative change is present. Type 1
acromion. No subacromial/subdeltoid bursal fluid.

Glenohumeral Joint: Minimal cartilage thinning is noted.
Intermediate increased T2 signal and mild thickening of the inferior
glenohumeral ligament are suggestive of adhesive capsulitis.

Labrum:  Intact.

Bones:  No fracture or worrisome lesion.

Other: None.
IMPRESSION: Mild rotator cuff tendinopathy without tear. Tendinopathy of the
intra-articular long head of biceps without tear also noted.

Mild acromioclavicular and minimal glenohumeral osteoarthritis.

Findings suggestive of adhesive capsulitis.

## 2024-11-12 ENCOUNTER — Ambulatory Visit: Admit: 2024-11-12 | Admitting: Ophthalmology

## 2024-11-19 SURGERY — REPAIR, ECTROPION, EYELID
Anesthesia: Monitor Anesthesia Care | Laterality: Bilateral
